# Patient Record
Sex: Male | Born: 1980 | Race: White | Hispanic: No | Marital: Married | Smoking: Former smoker
Health system: Southern US, Community
[De-identification: ages and names within clinical notes are randomized; demographics above are authoritative.]

## PROBLEM LIST (undated history)

## (undated) HISTORY — PX: HERNIA REPAIR: SHX51

---

## 2001-07-17 ENCOUNTER — Emergency Department (HOSPITAL_COMMUNITY): Admission: EM | Admit: 2001-07-17 | Discharge: 2001-07-18 | Payer: Self-pay

## 2001-07-18 ENCOUNTER — Encounter: Payer: Self-pay | Admitting: Emergency Medicine

## 2015-05-02 DIAGNOSIS — R61 Generalized hyperhidrosis: Secondary | ICD-10-CM | POA: Insufficient documentation

## 2016-07-12 ENCOUNTER — Emergency Department (HOSPITAL_COMMUNITY): Payer: BLUE CROSS/BLUE SHIELD | Admitting: Registered Nurse

## 2016-07-12 ENCOUNTER — Encounter (HOSPITAL_BASED_OUTPATIENT_CLINIC_OR_DEPARTMENT_OTHER): Payer: Self-pay | Admitting: *Deleted

## 2016-07-12 ENCOUNTER — Inpatient Hospital Stay (HOSPITAL_BASED_OUTPATIENT_CLINIC_OR_DEPARTMENT_OTHER)
Admission: EM | Admit: 2016-07-12 | Discharge: 2016-07-16 | DRG: 336 | Disposition: A | Discharge status: Home Health | Payer: BLUE CROSS/BLUE SHIELD | Attending: Surgery | Admitting: Surgery

## 2016-07-12 ENCOUNTER — Encounter (HOSPITAL_COMMUNITY): Admission: EM | Disposition: A | Discharge status: Home Health | Payer: Self-pay | Source: Home / Self Care

## 2016-07-12 ENCOUNTER — Emergency Department (HOSPITAL_BASED_OUTPATIENT_CLINIC_OR_DEPARTMENT_OTHER): Payer: BLUE CROSS/BLUE SHIELD

## 2016-07-12 DIAGNOSIS — Z87891 Personal history of nicotine dependence: Secondary | ICD-10-CM

## 2016-07-12 DIAGNOSIS — K560 Paralytic ileus: Secondary | ICD-10-CM | POA: Diagnosis not present

## 2016-07-12 DIAGNOSIS — K567 Ileus, unspecified: Secondary | ICD-10-CM

## 2016-07-12 DIAGNOSIS — R1031 Right lower quadrant pain: Secondary | ICD-10-CM | POA: Diagnosis present

## 2016-07-12 DIAGNOSIS — K352 Acute appendicitis with generalized peritonitis: Secondary | ICD-10-CM | POA: Diagnosis present

## 2016-07-12 DIAGNOSIS — K3532 Acute appendicitis with perforation and localized peritonitis, without abscess: Secondary | ICD-10-CM | POA: Insufficient documentation

## 2016-07-12 DIAGNOSIS — K436 Other and unspecified ventral hernia with obstruction, without gangrene: Secondary | ICD-10-CM

## 2016-07-12 DIAGNOSIS — K353 Acute appendicitis with localized peritonitis, without perforation or gangrene: Secondary | ICD-10-CM

## 2016-07-12 DIAGNOSIS — K420 Umbilical hernia with obstruction, without gangrene: Secondary | ICD-10-CM | POA: Diagnosis present

## 2016-07-12 DIAGNOSIS — K660 Peritoneal adhesions (postprocedural) (postinfection): Secondary | ICD-10-CM | POA: Diagnosis present

## 2016-07-12 DIAGNOSIS — K9189 Other postprocedural complications and disorders of digestive system: Secondary | ICD-10-CM

## 2016-07-12 DIAGNOSIS — K3580 Unspecified acute appendicitis: Secondary | ICD-10-CM

## 2016-07-12 HISTORY — PX: LAPAROSCOPIC APPENDECTOMY: SHX408

## 2016-07-12 LAB — COMPREHENSIVE METABOLIC PANEL
ALT: 18 U/L (ref 17–63)
AST: 16 U/L (ref 15–41)
Albumin: 3.9 g/dL (ref 3.5–5.0)
Alkaline Phosphatase: 73 U/L (ref 38–126)
Anion gap: 10 (ref 5–15)
BUN: 12 mg/dL (ref 6–20)
CO2: 22 mmol/L (ref 22–32)
Calcium: 9.1 mg/dL (ref 8.9–10.3)
Chloride: 105 mmol/L (ref 101–111)
Creatinine, Ser: 0.84 mg/dL (ref 0.61–1.24)
GFR calc Af Amer: >60 mL/min (ref >60)
GFR calc non Af Amer: >60 mL/min (ref >60)
Glucose, Bld: 126 mg/dL — ABNORMAL HIGH (ref 65–99)
Potassium: 3.6 mmol/L (ref 3.5–5.1)
Sodium: 137 mmol/L (ref 135–145)
Total Bilirubin: 2.4 mg/dL — ABNORMAL HIGH (ref 0.3–1.2)
Total Protein: 7.9 g/dL (ref 6.5–8.1)

## 2016-07-12 LAB — CBC WITH DIFFERENTIAL/PLATELET
BASOS PCT: 0 %
Basophils Absolute: 0 10*3/uL (ref 0.0–0.1)
EOS ABS: 0 10*3/uL (ref 0.0–0.7)
EOS PCT: 0 %
HCT: 45.1 % (ref 39.0–52.0)
HEMOGLOBIN: 16.1 g/dL (ref 13.0–17.0)
Lymphocytes Relative: 11 %
Lymphs Abs: 1.5 10*3/uL (ref 0.7–4.0)
MCH: 30.8 pg (ref 26.0–34.0)
MCHC: 35.7 g/dL (ref 30.0–36.0)
MCV: 86.2 fL (ref 78.0–100.0)
Monocytes Absolute: 1.2 10*3/uL — ABNORMAL HIGH (ref 0.1–1.0)
Monocytes Relative: 9 %
NEUTROS PCT: 80 %
Neutro Abs: 11 10*3/uL — ABNORMAL HIGH (ref 1.7–7.7)
PLATELETS: 157 10*3/uL (ref 150–400)
RBC: 5.23 MIL/uL (ref 4.22–5.81)
RDW: 12.6 % (ref 11.5–15.5)
WBC: 13.8 10*3/uL — AB (ref 4.0–10.5)

## 2016-07-12 LAB — URINALYSIS, ROUTINE W REFLEX MICROSCOPIC
Bilirubin Urine: NEGATIVE
Glucose, UA: NEGATIVE mg/dL
Hgb urine dipstick: NEGATIVE
Ketones, ur: 15 mg/dL — AB
Leukocytes, UA: NEGATIVE
Nitrite: NEGATIVE
Protein, ur: NEGATIVE mg/dL
Specific Gravity, Urine: 1.043 — ABNORMAL HIGH (ref 1.005–1.030)
pH: 6 (ref 5.0–8.0)

## 2016-07-12 SURGERY — APPENDECTOMY, LAPAROSCOPIC
Anesthesia: General | Site: Abdomen

## 2016-07-12 MED ORDER — MEPERIDINE HCL 25 MG/ML IJ SOLN
6.2500 mg | INTRAMUSCULAR | Status: DC | PRN
  Filled 2016-07-12: qty 1

## 2016-07-12 MED ORDER — TRAMADOL HCL 50 MG PO TABS: 50.0000 mg | ORAL_TABLET | Freq: Four times a day (QID) | ORAL | 0 refills | Status: DC | PRN

## 2016-07-12 MED ORDER — ACETAMINOPHEN 325 MG PO TABS
650.0000 mg | ORAL_TABLET | Freq: Four times a day (QID) | ORAL | Status: DC | PRN
  Filled 2016-07-12 (×2): qty 2

## 2016-07-12 MED ORDER — ONDANSETRON HCL 4 MG/2ML IJ SOLN: 4.0000 mg | Freq: Once | INTRAMUSCULAR | Status: DC | PRN

## 2016-07-12 MED ORDER — PIPERACILLIN-TAZOBACTAM 3.375 G IVPB
3.3750 g | Freq: Three times a day (TID) | INTRAVENOUS | Status: DC
  Administered 2016-07-12 – 2016-07-16 (×11): 3.375 g via INTRAVENOUS
  Filled 2016-07-12 (×12): qty 50

## 2016-07-12 MED ORDER — GABAPENTIN 300 MG PO CAPS: 300.0000 mg | ORAL_CAPSULE | ORAL | Status: DC

## 2016-07-12 MED ORDER — LIDOCAINE HCL (CARDIAC) 20 MG/ML IV SOLN
INTRAVENOUS | Status: DC | PRN
  Administered 2016-07-12: 75 mg via INTRAVENOUS

## 2016-07-12 MED ORDER — METRONIDAZOLE IN NACL 5-0.79 MG/ML-% IV SOLN: 500.0000 mg | Freq: Once | INTRAVENOUS | Status: DC

## 2016-07-12 MED ORDER — METOPROLOL TARTRATE 5 MG/5ML IV SOLN: 5.0000 mg | Freq: Four times a day (QID) | INTRAVENOUS | Status: DC | PRN

## 2016-07-12 MED ORDER — SUCCINYLCHOLINE CHLORIDE 20 MG/ML IJ SOLN
INTRAMUSCULAR | Status: DC | PRN
  Administered 2016-07-12: 140 mg via INTRAVENOUS

## 2016-07-12 MED ORDER — FENTANYL CITRATE (PF) 100 MCG/2ML IJ SOLN
INTRAMUSCULAR | Status: DC | PRN
  Administered 2016-07-12 (×4): 100 ug via INTRAVENOUS
  Administered 2016-07-12 (×2): 50 ug via INTRAVENOUS

## 2016-07-12 MED ORDER — PHENOL 1.4 % MT LIQD: 2.0000 | OROMUCOSAL | Status: DC | PRN

## 2016-07-12 MED ORDER — CHLORHEXIDINE GLUCONATE CLOTH 2 % EX PADS
6.0000 | MEDICATED_PAD | Freq: Once | CUTANEOUS | Status: AC
  Administered 2016-07-12: 6 via TOPICAL
  Filled 2016-07-12: qty 6

## 2016-07-12 MED ORDER — ONDANSETRON HCL 4 MG/2ML IJ SOLN
4.0000 mg | Freq: Four times a day (QID) | INTRAMUSCULAR | Status: DC | PRN
  Administered 2016-07-13 (×2): 4 mg via INTRAVENOUS
  Filled 2016-07-12 (×3): qty 2

## 2016-07-12 MED ORDER — DIPHENHYDRAMINE HCL 50 MG/ML IJ SOLN: 12.5000 mg | Freq: Four times a day (QID) | INTRAMUSCULAR | Status: DC | PRN

## 2016-07-12 MED ORDER — METRONIDAZOLE IN NACL 5-0.79 MG/ML-% IV SOLN
500.0000 mg | Freq: Three times a day (TID) | INTRAVENOUS | Status: DC
  Administered 2016-07-12 – 2016-07-14 (×6): 500 mg via INTRAVENOUS
  Filled 2016-07-12 (×6): qty 100

## 2016-07-12 MED ORDER — CELECOXIB 400 MG PO CAPS
400.0000 mg | ORAL_CAPSULE | ORAL | Status: DC
  Filled 2016-07-12: qty 1

## 2016-07-12 MED ORDER — SIMETHICONE 80 MG PO CHEW
40.0000 mg | CHEWABLE_TABLET | Freq: Four times a day (QID) | ORAL | Status: DC | PRN
  Administered 2016-07-14 (×2): 40 mg via ORAL
  Filled 2016-07-12 (×2): qty 1

## 2016-07-12 MED ORDER — ROCURONIUM BROMIDE 100 MG/10ML IV SOLN
INTRAVENOUS | Status: DC | PRN
  Administered 2016-07-12 (×2): 10 mg via INTRAVENOUS
  Administered 2016-07-12: 50 mg via INTRAVENOUS

## 2016-07-12 MED ORDER — ALUM & MAG HYDROXIDE-SIMETH 200-200-20 MG/5ML PO SUSP: 30.0000 mL | Freq: Four times a day (QID) | ORAL | Status: DC | PRN

## 2016-07-12 MED ORDER — ACETAMINOPHEN 650 MG RE SUPP
650.0000 mg | Freq: Four times a day (QID) | RECTAL | Status: DC | PRN
Start: 2016-07-12 — End: 2016-07-16

## 2016-07-12 MED ORDER — ONDANSETRON HCL 4 MG/2ML IJ SOLN
4.0000 mg | Freq: Once | INTRAMUSCULAR | Status: AC
  Administered 2016-07-12: 4 mg via INTRAVENOUS
  Filled 2016-07-12: qty 2

## 2016-07-12 MED ORDER — BUPIVACAINE-EPINEPHRINE 0.25% -1:200000 IJ SOLN
INTRAMUSCULAR | Status: DC | PRN
  Administered 2016-07-12: 50 mL

## 2016-07-12 MED ORDER — BUPIVACAINE-EPINEPHRINE 0.25% -1:200000 IJ SOLN
INTRAMUSCULAR | Status: AC
  Filled 2016-07-12: qty 1

## 2016-07-12 MED ORDER — ACETAMINOPHEN 500 MG PO TABS: 1000.0000 mg | ORAL_TABLET | ORAL | Status: DC

## 2016-07-12 MED ORDER — HYDROMORPHONE HCL 1 MG/ML IJ SOLN
INTRAMUSCULAR | Status: AC
  Filled 2016-07-12: qty 1

## 2016-07-12 MED ORDER — SUGAMMADEX SODIUM 200 MG/2ML IV SOLN
INTRAVENOUS | Status: DC | PRN
  Administered 2016-07-12: 200 mg via INTRAVENOUS

## 2016-07-12 MED ORDER — ONDANSETRON 4 MG PO TBDP: 4.0000 mg | ORAL_TABLET | Freq: Four times a day (QID) | ORAL | Status: DC | PRN

## 2016-07-12 MED ORDER — IOPAMIDOL (ISOVUE-300) INJECTION 61%
100.0000 mL | Freq: Once | INTRAVENOUS | Status: AC | PRN
  Administered 2016-07-12: 100 mL via INTRAVENOUS

## 2016-07-12 MED ORDER — LABETALOL HCL 5 MG/ML IV SOLN
INTRAVENOUS | Status: DC | PRN
  Administered 2016-07-12 (×4): 5 mg via INTRAVENOUS

## 2016-07-12 MED ORDER — LIP MEDEX EX OINT
1.0000 "application " | TOPICAL_OINTMENT | Freq: Two times a day (BID) | CUTANEOUS | Status: DC
  Administered 2016-07-12 – 2016-07-15 (×6): 1 via TOPICAL
  Filled 2016-07-12: qty 7

## 2016-07-12 MED ORDER — ENOXAPARIN SODIUM 40 MG/0.4ML ~~LOC~~ SOLN: 40.0000 mg | SUBCUTANEOUS | Status: DC

## 2016-07-12 MED ORDER — KETOROLAC TROMETHAMINE 15 MG/ML IJ SOLN
15.0000 mg | Freq: Four times a day (QID) | INTRAMUSCULAR | Status: DC | PRN
  Administered 2016-07-13: 30 mg via INTRAVENOUS
  Filled 2016-07-12: qty 2

## 2016-07-12 MED ORDER — HYDROMORPHONE HCL 2 MG/ML IJ SOLN
INTRAMUSCULAR | Status: AC
  Filled 2016-07-12: qty 1

## 2016-07-12 MED ORDER — ENOXAPARIN SODIUM 40 MG/0.4ML ~~LOC~~ SOLN
40.0000 mg | SUBCUTANEOUS | Status: DC
  Administered 2016-07-13 – 2016-07-16 (×4): 40 mg via SUBCUTANEOUS
  Filled 2016-07-12 (×4): qty 0.4

## 2016-07-12 MED ORDER — MIDAZOLAM HCL 5 MG/5ML IJ SOLN
INTRAMUSCULAR | Status: DC | PRN
  Administered 2016-07-12: 2 mg via INTRAVENOUS

## 2016-07-12 MED ORDER — METHOCARBAMOL 1000 MG/10ML IJ SOLN: 1000.0000 mg | Freq: Four times a day (QID) | INTRAVENOUS | Status: DC | PRN

## 2016-07-12 MED ORDER — SODIUM CHLORIDE 0.9 % IV BOLUS (SEPSIS)
1000.0000 mL | Freq: Once | INTRAVENOUS | Status: AC
  Administered 2016-07-12: 1000 mL via INTRAVENOUS

## 2016-07-12 MED ORDER — LACTATED RINGERS IV SOLN
INTRAVENOUS | Status: DC | PRN
  Administered 2016-07-12 (×3): via INTRAVENOUS

## 2016-07-12 MED ORDER — BISACODYL 10 MG RE SUPP: 10.0000 mg | Freq: Two times a day (BID) | RECTAL | Status: DC | PRN

## 2016-07-12 MED ORDER — PROPOFOL 10 MG/ML IV BOLUS
INTRAVENOUS | Status: DC | PRN
  Administered 2016-07-12: 30 mg via INTRAVENOUS
  Administered 2016-07-12: 250 mg via INTRAVENOUS
  Administered 2016-07-12: 70 mg via INTRAVENOUS

## 2016-07-12 MED ORDER — HYDROMORPHONE HCL 1 MG/ML IJ SOLN
0.2500 mg | INTRAMUSCULAR | Status: DC | PRN
  Administered 2016-07-12 (×2): 0.5 mg via INTRAVENOUS

## 2016-07-12 MED ORDER — HYDROMORPHONE HCL 1 MG/ML IJ SOLN
0.5000 mg | INTRAMUSCULAR | Status: DC | PRN
  Administered 2016-07-12 – 2016-07-13 (×5): 1 mg via INTRAVENOUS
  Filled 2016-07-12 (×5): qty 1

## 2016-07-12 MED ORDER — MAGIC MOUTHWASH
15.0000 mL | Freq: Four times a day (QID) | ORAL | Status: DC | PRN
Start: 2016-07-12 — End: 2016-07-16
  Filled 2016-07-12: qty 15

## 2016-07-12 MED ORDER — FENTANYL CITRATE (PF) 250 MCG/5ML IJ SOLN
INTRAMUSCULAR | Status: AC
  Filled 2016-07-12: qty 5

## 2016-07-12 MED ORDER — LACTATED RINGERS IV BOLUS (SEPSIS): 1000.0000 mL | Freq: Three times a day (TID) | INTRAVENOUS | Status: AC | PRN

## 2016-07-12 MED ORDER — HYDROMORPHONE HCL 1 MG/ML IJ SOLN
INTRAMUSCULAR | Status: DC | PRN
  Administered 2016-07-12 (×2): 1 mg via INTRAVENOUS

## 2016-07-12 MED ORDER — ONDANSETRON HCL 4 MG/2ML IJ SOLN
INTRAMUSCULAR | Status: DC | PRN
  Administered 2016-07-12: 4 mg via INTRAVENOUS

## 2016-07-12 MED ORDER — MENTHOL 3 MG MT LOZG
1.0000 | LOZENGE | OROMUCOSAL | Status: DC | PRN
  Filled 2016-07-12: qty 9

## 2016-07-12 MED ORDER — SACCHAROMYCES BOULARDII 250 MG PO CAPS
250.0000 mg | ORAL_CAPSULE | Freq: Two times a day (BID) | ORAL | Status: DC
  Administered 2016-07-12 – 2016-07-16 (×8): 250 mg via ORAL
  Filled 2016-07-12 (×8): qty 1

## 2016-07-12 MED ORDER — HYDRALAZINE HCL 20 MG/ML IJ SOLN: 10.0000 mg | INTRAMUSCULAR | Status: DC | PRN

## 2016-07-12 MED ORDER — PROCHLORPERAZINE EDISYLATE 5 MG/ML IJ SOLN: 5.0000 mg | INTRAMUSCULAR | Status: DC | PRN

## 2016-07-12 MED ORDER — ONDANSETRON HCL 4 MG/2ML IJ SOLN
4.0000 mg | Freq: Four times a day (QID) | INTRAMUSCULAR | Status: DC | PRN
  Administered 2016-07-14 – 2016-07-15 (×3): 4 mg via INTRAVENOUS
  Filled 2016-07-12 (×2): qty 2

## 2016-07-12 MED ORDER — LACTATED RINGERS IV SOLN: INTRAVENOUS | Status: DC

## 2016-07-12 MED ORDER — DEXTROSE 5 % IV SOLN
1.0000 g | Freq: Once | INTRAVENOUS | Status: DC
  Filled 2016-07-12: qty 10

## 2016-07-12 MED ORDER — SODIUM CHLORIDE 0.9 % IV SOLN: 8.0000 mg | Freq: Four times a day (QID) | INTRAVENOUS | Status: DC | PRN

## 2016-07-12 MED ORDER — DEXTROSE 5 % IV SOLN
2.0000 g | INTRAVENOUS | Status: AC
  Administered 2016-07-12: 2 g via INTRAVENOUS
  Filled 2016-07-12: qty 2

## 2016-07-12 MED ORDER — SODIUM CHLORIDE 0.9 % IR SOLN
Status: DC | PRN
  Administered 2016-07-12: 1000 mL

## 2016-07-12 MED ORDER — DEXAMETHASONE SODIUM PHOSPHATE 10 MG/ML IJ SOLN
INTRAMUSCULAR | Status: DC | PRN
  Administered 2016-07-12: 10 mg via INTRAVENOUS

## 2016-07-12 MED ORDER — METOCLOPRAMIDE HCL 5 MG/ML IJ SOLN
5.0000 mg | Freq: Four times a day (QID) | INTRAMUSCULAR | Status: DC | PRN
  Administered 2016-07-13 (×3): 10 mg via INTRAVENOUS
  Filled 2016-07-12 (×3): qty 2

## 2016-07-12 MED ORDER — DIPHENHYDRAMINE HCL 12.5 MG/5ML PO ELIX: 12.5000 mg | ORAL_SOLUTION | Freq: Four times a day (QID) | ORAL | Status: DC | PRN

## 2016-07-12 MED ORDER — 0.9 % SODIUM CHLORIDE (POUR BTL) OPTIME
TOPICAL | Status: DC | PRN
  Administered 2016-07-12 (×2): 3000 mL
  Administered 2016-07-12 (×2): 1000 mL

## 2016-07-12 SURGICAL SUPPLY — 43 items
APPLIER CLIP 5 13 M/L LIGAMAX5 (MISCELLANEOUS)
APPLIER CLIP ROT 10 11.4 M/L (STAPLE)
CABLE HIGH FREQUENCY MONO STRZ (ELECTRODE) ×2 IMPLANT
CHLORAPREP W/TINT 26ML (MISCELLANEOUS) ×2 IMPLANT
CLIP APPLIE 5 13 M/L LIGAMAX5 (MISCELLANEOUS) IMPLANT
CLIP APPLIE ROT 10 11.4 M/L (STAPLE) IMPLANT
COVER SURGICAL LIGHT HANDLE (MISCELLANEOUS) ×2 IMPLANT
CUTTER FLEX LINEAR 45M (STAPLE) ×2 IMPLANT
DECANTER SPIKE VIAL GLASS SM (MISCELLANEOUS) ×2 IMPLANT
DEVICE TROCAR PUNCTURE CLOSURE (ENDOMECHANICALS) IMPLANT
DRAPE LAPAROSCOPIC ABDOMINAL (DRAPES) ×2 IMPLANT
DRAPE WARM FLUID 44X44 (DRAPE) ×2 IMPLANT
DRSG TEGADERM 2-3/8X2-3/4 SM (GAUZE/BANDAGES/DRESSINGS) ×2 IMPLANT
DRSG TEGADERM 4X4.75 (GAUZE/BANDAGES/DRESSINGS) ×2 IMPLANT
ELECT REM PT RETURN 9FT ADLT (ELECTROSURGICAL) ×2
ELECTRODE REM PT RTRN 9FT ADLT (ELECTROSURGICAL) ×1 IMPLANT
ENDOLOOP SUT PDS II  0 18 (SUTURE)
ENDOLOOP SUT PDS II 0 18 (SUTURE) IMPLANT
GAUZE SPONGE 2X2 8PLY STRL LF (GAUZE/BANDAGES/DRESSINGS) ×1 IMPLANT
GLOVE ECLIPSE 8.0 STRL XLNG CF (GLOVE) ×2 IMPLANT
GLOVE INDICATOR 8.0 STRL GRN (GLOVE) ×2 IMPLANT
GOWN STRL REUS W/TWL XL LVL3 (GOWN DISPOSABLE) ×4 IMPLANT
IRRIG SUCT STRYKERFLOW 2 WTIP (MISCELLANEOUS) ×2
IRRIGATION SUCT STRKRFLW 2 WTP (MISCELLANEOUS) ×1 IMPLANT
KIT BASIN OR (CUSTOM PROCEDURE TRAY) ×2 IMPLANT
PAD POSITIONING PINK XL (MISCELLANEOUS) ×2 IMPLANT
POUCH SPECIMEN RETRIEVAL 10MM (ENDOMECHANICALS) ×2 IMPLANT
RELOAD 45 VASCULAR/THIN (ENDOMECHANICALS) IMPLANT
RELOAD STAPLE TA45 3.5 REG BLU (ENDOMECHANICALS) ×2 IMPLANT
SCISSORS LAP 5X35 DISP (ENDOMECHANICALS) ×2 IMPLANT
SHEARS HARMONIC ACE PLUS 36CM (ENDOMECHANICALS) IMPLANT
SLEEVE XCEL OPT CAN 5 100 (ENDOMECHANICALS) ×2 IMPLANT
SPONGE GAUZE 2X2 STER 10/PKG (GAUZE/BANDAGES/DRESSINGS) ×1
SUT MNCRL AB 4-0 PS2 18 (SUTURE) ×2 IMPLANT
SUT PDS AB 0 CT1 36 (SUTURE) ×2 IMPLANT
SUT PDS AB 1 CT1 27 (SUTURE) IMPLANT
SUT SILK 2 0 SH (SUTURE) IMPLANT
TOWEL OR 17X26 10 PK STRL BLUE (TOWEL DISPOSABLE) ×2 IMPLANT
TRAY FOLEY W/METER SILVER 16FR (SET/KITS/TRAYS/PACK) IMPLANT
TRAY LAPAROSCOPIC (CUSTOM PROCEDURE TRAY) ×2 IMPLANT
TROCAR BLADELESS OPT 5 100 (ENDOMECHANICALS) ×2 IMPLANT
TROCAR XCEL 12X100 BLDLESS (ENDOMECHANICALS) ×2 IMPLANT
TUBING INSUF HEATED (TUBING) ×2 IMPLANT

## 2016-07-12 NOTE — ED Notes (Signed)
ED Provider at bedside. 

## 2016-07-12 NOTE — H&P (Signed)
[facility]  [address] [place], [place] [address] Phone: [phone] FAX: [phone]     Albert Jones  Apr 26, 1981 [id number]  CARE TEAM:  PCP: Wonda Cerise, MD  Outpatient Care Team: Patient Care Team: Jefm Petty, MD as PCP - General (Family Medicine)  Inpatient Treatment Team: Treatment Team: Attending Provider: [name], MD; Registered Nurse: Elwanda Brooklyn, RN   This patient is a 36 y.o.male who presents today for surgical evaluation at the request of Dr Stark Jock, [facility] ED, [place].   Reason for evaluation: Acute appendicitis  52 year old healthy male began abdominal pain about four days ago.  Wife had abd pain & diarrhea for 24hrs beforehand.  Pt not nearly as sick.  Persistent.  Went to an outside urgent care Center.  Evaluation without any major concerns.  However patient's pain became worse and more intense.  Not had any fevers or chills.  Some nausea.  Decreased appetite and anorexia.  Pain more intense.  Worse with walking and coughing.  Concerned.  Went to [facility].  Evaluation concerning.  CT scan highly suspicious of appendicitis.  No obvious free air or abscess noted.  Surgical consultation requested.  Recommendation made for transfer to was in the hospital or surgical service is available.  No personal nor family history of GI/colon cancer, inflammatory bowel disease, irritable bowel syndrome, allergy such as Celiac Sprue, dietary/dairy problems, colitis, ulcers nor gastritis.  No travel outside the country.  No changes in diet.  No dysphagia to solids or liquids.  No significant heartburn or reflux.  No hematochezia, hematemesis, coffee ground emesis.  No evidence of prior gastric/peptic ulceration.    Assessment  Albert Jones  36 y.o. male       Problem List:  Principal Problem:   Acute appendicitis Active Problems:   Incarcerated epigastric hernia   History and  physical and CT scan very suspicious for acute appendicitis  Plan:  Diagnostic laparoscopy with appendectomy:  The anatomy & physiology of the digestive tract was discussed.  The pathophysiology of appendicitis and other appendiceal disorders were discussed.  Natural history risks without surgery was discussed.   I feel the risks of no intervention will lead to serious problems that outweigh the operative risks; therefore, I recommended diagnostic laparoscopy with removal of appendix to remove the pathology.  Laparoscopic & open techniques were discussed.   I noted a good likelihood this will help address the problem.   Risks such as bleeding, infection, abscess, leak, reoperation, injury to other organs, need for repair of tissues / organs, possible ostomy, hernia, heart attack, stroke, death, and other risks were discussed.  Goals of post-operative recovery were discussed as well.  We will work to minimize complications.  Questions were answered.  The patient expresses understanding & wishes to proceed with surgery.  -Will plan to reduce & place port at supraumb hernia.  Just primary repair, no mesh in this situation:  The anatomy & physiology of the abdominal wall was discussed.  The pathophysiology of hernias was discussed.  Natural history risks without surgery including progeressive enlargement, pain, incarceration, & strangulation was discussed.   Contributors to complications such as smoking, obesity, diabetes, prior surgery, etc were discussed.   I feel the risks of no intervention will lead to serious problems that outweigh the operative risks; therefore, I recommended surgery to reduce and repair the hernia.  I explained laparoscopic techniques with possible need for an open  approach.  I noted the probable use of mesh to patch and/or buttress the hernia repair  Risks such as bleeding, infection, abscess, need for further treatment, injury to other organs, need for repair of tissues /  organs, stroke, heart attack, death, and other risks were discussed.  I noted a good likelihood this will help address the problem.   Goals of post-operative recovery were discussed as well.  Possibility that this will not correct all symptoms was explained.  I stressed the importance of low-impact activity, aggressive pain control, avoiding constipation, & not pushing through pain to minimize risk of post-operative chronic pain or injury. Possibility of reherniation especially with smoking, obesity, diabetes, immunosuppression, and other health conditions was discussed.  We will work to minimize complications.     An educational handout further explaining the pathology & treatment options was given as well.  Questions were answered.  The patient expresses understanding & wishes to proceed with surgery.      -VTE prophylaxis- SCDs, etc -mobilize as tolerated to help recovery    Adin Hector, M.D., F.A.C.S. Gastrointestinal and Minimally Invasive Surgery [facility] [address], [place], [place] [address] [phone] Main / Paging   07/12/2016      History reviewed. No pertinent past medical history.  History reviewed. No pertinent surgical history.  Social History   Social History  . Marital status: Married    Spouse name: N/A  . Number of children: N/A  . Years of education: N/A   Occupational History  . Not on file.   Social History Main Topics  . Smoking status: Former Research scientist (life sciences)  . Smokeless tobacco: Former Systems developer  . Alcohol use Yes     Comment: occassionally  . Drug use: Yes    Frequency: 3.0 times per week    Types: Marijuana  . Sexual activity: Not on file   Other Topics Concern  . Not on file   Social History Narrative  . No narrative on file    No family history on file.  Current Facility-Administered Medications  Medication Dose Route Frequency Provider Last Rate Last Dose  . cefTRIAXone (ROCEPHIN) 1 g in  dextrose 5 % 50 mL IVPB  1 g Intravenous Once [name], MD      . metroNIDAZOLE (FLAGYL) IVPB 500 mg  500 mg Intravenous Once [name], MD       No current outpatient prescriptions on file.     No Known Allergies  ROS: Constitutional:  No fevers, chills, sweats.  Weight stable Eyes:  No vision changes, No discharge HENT:  No sore throats, nasal drainage Lymph: No neck swelling, No bruising easily Pulmonary:  No cough, productive sputum CV: No orthopnea, PND  Patient walks 30 minutes for about 1 miles without difficulty.  No exertional chest/neck/shoulder/arm pain. GI: No personal nor family history of GI/colon cancer, inflammatory bowel disease, irritable bowel syndrome, allergy such as Celiac Sprue, dietary/dairy problems, colitis, ulcers nor gastritis.  No recent sick contacts/gastroenteritis.  No travel outside the country.  No changes in diet. Renal: No UTIs, No hematuria Genital:  No drainage, bleeding, masses Musculoskeletal: No severe joint pain.  Good ROM major joints Skin:  No sores or lesions.  No rashes Heme/Lymph:  No easy bleeding.  No swollen lymph nodes Neuro: No focal weakness/numbness.  No seizures Psych: No suicidal ideation.  No hallucinations  BP 130/80 (BP Location: Right Arm)   Pulse 86   Temp 98.6 F (37 C) (Oral)  Resp 14   Ht 6' (1.829 m)   Wt 95.3 kg (210 lb)   SpO2 98%   BMI 28.48 kg/m   Physical Exam: General: Pt awake/alert/oriented x4 in no major acute distress Eyes: PERRL, normal EOM. Sclera nonicteric Neuro: CN II-XII intact w/o focal sensory/motor deficits. Lymph: No head/neck/groin lymphadenopathy Psych:  No delerium/psychosis/paranoia HENT: Normocephalic, Mucus membranes moist.  No thrush Neck: Supple, No tracheal deviation Chest: No pain.  Good respiratory excursion. CV:  Pulses intact.  Regular rhythm Abdomen: Soft, TTP RLQ with guarding & percussive TTP.  Supraumb SQ mass c/w incarc hernia.  Nondistended.  Nontender.  No  incarcerated hernias. Gen:  No inguinal hernias.  No inguinal lymphadenopathy.   Ext:  SCDs BLE.  No significant edema.  No cyanosis Skin: No petechiae / purpurea.  No major sores Musculoskeletal: No severe joint pain.  Good ROM major joints   Results:   Labs: Results for orders placed or performed during the hospital encounter of 07/12/16 (from the past 48 hour(s))  Comprehensive metabolic panel     Status: Abnormal   Collection Time: 07/12/16 11:00 AM  Result Value Ref Range   Sodium 137 135 - 145 mmol/L   Potassium 3.6 3.5 - 5.1 mmol/L   Chloride 105 101 - 111 mmol/L   CO2 22 22 - 32 mmol/L   Glucose, Bld 126 (H) 65 - 99 mg/dL   BUN 12 6 - 20 mg/dL   Creatinine, Ser 0.84 0.61 - 1.24 mg/dL   Calcium 9.1 8.9 - 10.3 mg/dL   Total Protein 7.9 6.5 - 8.1 g/dL   Albumin 3.9 3.5 - 5.0 g/dL   AST 16 15 - 41 U/L   ALT 18 17 - 63 U/L   Alkaline Phosphatase 73 38 - 126 U/L   Total Bilirubin 2.4 (H) 0.3 - 1.2 mg/dL   GFR calc non Af Amer >60 >60 mL/min   GFR calc Af Amer >60 >60 mL/min    Comment: (NOTE) The eGFR has been calculated using the CKD EPI equation. This calculation has not been validated in all clinical situations. eGFR's persistently <60 mL/min signify possible Chronic Kidney Disease.    Anion gap 10 5 - 15  CBC with Differential     Status: Abnormal   Collection Time: 07/12/16 11:00 AM  Result Value Ref Range   WBC 13.8 (H) 4.0 - 10.5 K/uL   RBC 5.23 4.22 - 5.81 MIL/uL   Hemoglobin 16.1 13.0 - 17.0 g/dL   HCT 45.1 39.0 - 52.0 %   MCV 86.2 78.0 - 100.0 fL   MCH 30.8 26.0 - 34.0 pg   MCHC 35.7 30.0 - 36.0 g/dL   RDW 12.6 11.5 - 15.5 %   Platelets 157 150 - 400 K/uL   Neutrophils Relative % 80 %   Neutro Abs 11.0 (H) 1.7 - 7.7 K/uL   Lymphocytes Relative 11 %   Lymphs Abs 1.5 0.7 - 4.0 K/uL   Monocytes Relative 9 %   Monocytes Absolute 1.2 (H) 0.1 - 1.0 K/uL   Eosinophils Relative 0 %   Eosinophils Absolute 0.0 0.0 - 0.7 K/uL   Basophils Relative 0 %    Basophils Absolute 0.0 0.0 - 0.1 K/uL  Urinalysis, Routine w reflex microscopic     Status: Abnormal   Collection Time: 07/12/16 12:10 PM  Result Value Ref Range   Color, Urine AMBER (A) YELLOW    Comment: BIOCHEMICALS MAY BE AFFECTED BY COLOR   APPearance CLEAR CLEAR  Specific Gravity, Urine 1.043 (H) 1.005 - 1.030   pH 6.0 5.0 - 8.0   Glucose, UA NEGATIVE NEGATIVE mg/dL   Hgb urine dipstick NEGATIVE NEGATIVE   Bilirubin Urine NEGATIVE NEGATIVE   Ketones, ur 15 (A) NEGATIVE mg/dL   Protein, ur NEGATIVE NEGATIVE mg/dL   Nitrite NEGATIVE NEGATIVE   Leukocytes, UA NEGATIVE NEGATIVE    Comment: Microscopic not done on urines with negative protein, blood, leukocytes, nitrite, or glucose < 500 mg/dL.    Imaging / Studies: Ct Abdomen Pelvis W Contrast  Result Date: 07/12/2016 CLINICAL DATA:  Right-sided abdominal pain 5 days with nausea and some vomiting. Diaphoresis today. Elevated white blood cell count. EXAM: CT ABDOMEN AND PELVIS WITH CONTRAST TECHNIQUE: Multidetector CT imaging of the abdomen and pelvis was performed using the standard protocol following bolus administration of intravenous contrast. CONTRAST:  144m ISOVUE-300 IOPAMIDOL (ISOVUE-300) INJECTION 61% COMPARISON:  08/03/2011 FINDINGS: Lower chest: Bibasilar linear atelectasis. Calcified granuloma over the right lower lobe. Hepatobiliary: Within normal. Pancreas: Within normal. Spleen: Within normal. Adrenals/Urinary Tract: Adrenal glands are normal. Kidneys are normal in size without hydronephrosis or nephrolithiasis. Ureters and bladder are normal. Stomach/Bowel: Stomach is normal. Small bowel is unremarkable. Colon is within normal. There is a moderately inflamed appendix in the right lower quadrant in the retrocecal location with appendicolith at the tip. There is adjacent inflammatory change and small amount of adjacent free fluid. No evidence of perforation or abscess. Few small adjacent mesenteric lymph nodes.  Vascular/Lymphatic: Within normal. Reproductive: Within normal. Other: Suggestion of a tiny and ventral hernia over the midline upper abdomen containing only cement are mesocolon mesenteric fat. Musculoskeletal: Within normal. IMPRESSION: Evidence of acute appendicitis. No perforation or abscess. Appendix is retrocecal in location. Bibasilar linear atelectasis. Tiny midline ventral hernia over the upper abdomen containing only mesenteric fat. Critical Value/emergent results were called by telephone at the time of interpretation on 07/12/2016 at 12:34 pm to Dr. DVeryl Speak, who verbally acknowledged these results. Electronically Signed   By: DMarin OlpM.D.   On: 07/12/2016 12:34    Medications / Allergies: per chart  Antibiotics: Anti-infectives    Start     Dose/Rate Route Frequency Ordered Stop   07/12/16 1315  cefTRIAXone (ROCEPHIN) 1 g in dextrose 5 % 50 mL IVPB     1 g 100 mL/hr over 30 Minutes Intravenous  Once 07/12/16 1303     07/12/16 1315  metroNIDAZOLE (FLAGYL) IVPB 500 mg     500 mg 100 mL/hr over 60 Minutes Intravenous  Once 07/12/16 1303          Note: Portions of this report may have been transcribed using voice recognition software. Every effort was made to ensure accuracy; however, inadvertent computerized transcription errors may be present.   Any transcriptional errors that result from this process are unintentional.    SAdin Hector M.D., F.A.C.S. Gastrointestinal and Minimally Invasive Surgery [facility] [address] [place] [phone] / Paging   07/12/2016

## 2016-07-12 NOTE — ED Provider Notes (Signed)
[facility] Provider Note   CSN: [id number] Arrival date & time: 07/12/16  0940     History   Chief Complaint Chief Complaint  Patient presents with  . Abdominal Pain    right     HPI Albert Jones is a 36 y.o. male.  Patient is a 36 year old male with no significant past medical history. He presents for evaluation of right-sided abdominal pain. This began 4 days ago and is worsening. He was seen 2 days ago in urgent care, however no cause was found. His pain is worsening. He denies any fevers or chills. He reports decreased appetite and decreased by mouth intake.   The history is provided by the patient.  Abdominal Pain   This is a new problem. Episode onset: 4 days ago. The problem occurs constantly. The problem has been gradually worsening. The pain is located in the RLQ. The pain is moderate. Associated symptoms include nausea. Pertinent negatives include fever, flatus and vomiting. Exacerbated by: Walking and palpation. Nothing relieves the symptoms.    History reviewed. No pertinent past medical history.  There are no active problems to display for this patient.   History reviewed. No pertinent surgical history.     Home Medications    Prior to Admission medications   Not on File    Family History No family history on file.  Social History Social History  Substance Use Topics  . Smoking status: Former Games developermoker  . Smokeless tobacco: Former NeurosurgeonUser  . Alcohol use Yes     Comment: occassionally     Allergies   Patient has no known allergies.   Review of Systems Review of Systems  Constitutional: Negative for fever.  Gastrointestinal: Positive for abdominal pain and nausea. Negative for flatus and vomiting.  All other systems reviewed and are negative.    Physical Exam Updated Vital Signs BP (!) 128/91 (BP Location: Left Arm)   Pulse (!) 102   Temp 98.6 F (37 C) (Oral)   Resp (!) 22   Ht 6' (1.829 m)   Wt 210 lb (95.3 kg)   SpO2  98%   BMI 28.48 kg/m   Physical Exam  Constitutional: He is oriented to person, place, and time. He appears well-developed and well-nourished. No distress.  HENT:  Head: Normocephalic and atraumatic.  Mouth/Throat: Oropharynx is clear and moist.  Neck: Normal range of motion. Neck supple.  Cardiovascular: Normal rate and regular rhythm.  Exam reveals no friction rub.   No murmur heard. Pulmonary/Chest: Effort normal and breath sounds normal. No respiratory distress. He has no wheezes. He has no rales.  Abdominal: Soft. Bowel sounds are normal. He exhibits no distension. There is tenderness.  There is tenderness in the right lower quadrant and right mid abdomen. There is no rebound or guarding.  Musculoskeletal: Normal range of motion. He exhibits no edema.  Neurological: He is alert and oriented to person, place, and time. Coordination normal.  Skin: Skin is warm and dry. He is not diaphoretic.  Nursing note and vitals reviewed.    ED Treatments / Results  Labs (all labs ordered are listed, but only abnormal results are displayed) Labs Reviewed  URINALYSIS, ROUTINE W REFLEX MICROSCOPIC  COMPREHENSIVE METABOLIC PANEL  CBC WITH DIFFERENTIAL/PLATELET    EKG  EKG Interpretation None       Radiology No results found.  Procedures Procedures (including critical care time)  Medications Ordered in ED Medications  sodium chloride 0.9 % bolus 1,000 mL (not administered)  Initial Impression / Assessment and Plan / ED Course  I have reviewed the triage vital signs and the nursing notes.  Pertinent labs & imaging results that were available during my care of the patient were reviewed by me and considered in my medical decision making (see chart for details).  CT scan reveals appendicitis.  I have discussed this finding with Dr. Michaell Cowing from general surgery. He is recommending an ER to ER transfer to [place] [facility]. I spoke with Dr. Adriana Simas who agrees to accept the patient in  transfer. Patient will go by private auto. His antibiotics will be deferred until he reaches [facility] ER so as to not delay transfer.  Final Clinical Impressions(s) / ED Diagnoses   Final diagnoses:  None    New Prescriptions New Prescriptions   No medications on file     Geoffery Lyons, MD 07/12/16 [phone]

## 2016-07-12 NOTE — ED Triage Notes (Addendum)
Patient states he developed right sided abdominal pain four days ago.  States he was seen at an Urgent Care two days ago and had xray which was negative for stool and stones.  States the pain has progressively gotten worse and is associated with nausea and small amounts vomiting and diarrhea.  Firm area noted on the mid right abdomen.  Lifted heavy objects the day before pain started.

## 2016-07-12 NOTE — Discharge Instructions (Signed)
SURGERY: POST OP INSTRUCTIONS °(Surgery for small bowel obstruction, colon resection, etc) ° ° °###################################################################### ° °EAT °Gradually transition to a high fiber diet with a fiber supplement over the next few days after discharge ° °WALK °Walk an hour a day.  Control your pain to do that.   ° °CONTROL PAIN °Control pain so that you can walk, sleep, tolerate sneezing/coughing, go up/down stairs. ° °HAVE A BOWEL MOVEMENT DAILY °Keep your bowels regular to avoid problems.  OK to try a laxative to override constipation.  OK to use an antidairrheal to slow down diarrhea.  Call if not better after 2 tries ° °CALL IF YOU HAVE PROBLEMS/CONCERNS °Call if you are still struggling despite following these instructions. °Call if you have concerns not answered by these instructions ° °###################################################################### ° ° °DIET °Follow a light diet the first few days at home.  Start with a bland diet such as soups, liquids, starchy foods, low fat foods, etc.  If you feel full, bloated, or constipated, stay on a ful liquid or pureed/blenderized diet for a few days until you feel better and no longer constipated. °Be sure to drink plenty of fluids every day to avoid getting dehydrated (feeling dizzy, not urinating, etc.). °Gradually add a fiber supplement to your diet over the next week.  Gradually get back to a regular solid diet.  Avoid fast food or heavy meals the first week as you are more likely to get nauseated. °It is expected for your digestive tract to need a few months to get back to normal.  It is common for your bowel movements and stools to be irregular.  You will have occasional bloating and cramping that should eventually fade away.  Until you are eating solid food normally, off all pain medications, and back to regular activities; your bowels will not be normal. °Focus on eating a low-fat, high fiber diet the rest of your life  (See Getting to Good Bowel Health, below). ° °CARE of your INCISION or WOUND °It is good for closed incision and even open wounds to be washed every day.  Shower every day.  Short baths are fine.  Wash the incisions and wounds clean with soap & water.    °If you have a closed incision(s), wash the incision with soap & water every day.  You may leave closed incisions open to air if it is dry.   You may cover the incision with clean gauze & replace it after your daily shower for comfort. °If you have skin tapes (Steristrips) or skin glue (Dermabond) on your incision, leave them in place.  They will fall off on their own like a scab.  You may trim any edges that curl up with clean scissors.  If you have staples, set up an appointment for them to be removed in the office in 10 days after surgery.  °If you have a drain, wash around the skin exit site with soap & water and place a new dressing of gauze or band aid around the skin every day.  Keep the drain site clean & dry.    °If you have an open wound with packing, see wound care instructions.  In general, it is encouraged that you remove your dressing and packing, shower with soap & water, and replace your dressing once a day.  Pack the wound with clean gauze moistened with normal (0.9%) saline to keep the wound moist & uninfected.  Pressure on the dressing for 30 minutes will stop most wound   bleeding.  Eventually your body will heal & pull the open wound closed over the next few months.  °Raw open wounds will occasionally bleed or secrete yellow drainage until it heals closed.  Drain sites will drain a little until the drain is removed.  Even closed incisions can have mild bleeding or drainage the first few days until the skin edges scab over & seal.   °If you have an open wound with a wound vac, see wound vac care instructions. ° ° ° ° °ACTIVITIES as tolerated °Start light daily activities --- self-care, walking, climbing stairs-- beginning the day after surgery.   Gradually increase activities as tolerated.  Control your pain to be active.  Stop when you are tired.  Ideally, walk several times a day, eventually an hour a day.   °Most people are back to most day-to-day activities in a few weeks.  It takes 4-8 weeks to get back to unrestricted, intense activity. °If you can walk 30 minutes without difficulty, it is safe to try more intense activity such as jogging, treadmill, bicycling, low-impact aerobics, swimming, etc. °Save the most intensive and strenuous activity for last (Usually 4-8 weeks after surgery) such as sit-ups, heavy lifting, contact sports, etc.  Refrain from any intense heavy lifting or straining until you are off narcotics for pain control.  You will have off days, but things should improve week-by-week. °DO NOT PUSH THROUGH PAIN.  Let pain be your guide: If it hurts to do something, don't do it.  Pain is your body warning you to avoid that activity for another week until the pain goes down. °You may drive when you are no longer taking narcotic prescription pain medication, you can comfortably wear a seatbelt, and you can safely make sudden turns/stops to protect yourself without hesitating due to pain. °You may have sexual intercourse when it is comfortable. If it hurts to do something, stop. ° °MEDICATIONS °Take your usually prescribed home medications unless otherwise directed.   °Blood thinners:  °Usually you can restart any strong blood thinners after the second postoperative day.  It is OK to take aspirin right away.    ° If you are on strong blood thinners (warfarin/Coumadin, Plavix, Xerelto, Eliquis, Pradaxa, etc), discuss with your surgeon, medicine PCP, and/or cardiologist for instructions on when to restart the blood thinner & if blood monitoring is needed (PT/INR blood check, etc).   ° ° °PAIN CONTROL °Pain after surgery or related to activity is often due to strain/injury to muscle, tendon, nerves and/or incisions.  This pain is usually  short-term and will improve in a few months.  °To help speed the process of healing and to get back to regular activity more quickly, DO THE FOLLOWING THINGS TOGETHER: °1. Increase activity gradually.  DO NOT PUSH THROUGH PAIN °2. Use Ice and/or Heat °3. Try Gentle Massage and/or Stretching °4. Take over the counter pain medication °5. Take Narcotic prescription pain medication for more severe pain ° °Good pain control = faster recovery.  It is better to take more medicine to be more active than to stay in bed all day to avoid medications. °1.  Increase activity gradually °Avoid heavy lifting at first, then increase to lifting as tolerated over the next 6 weeks. °Do not “push through” the pain.  Listen to your body and avoid positions and maneuvers than reproduce the pain.  Wait a few days before trying something more intense °Walking an hour a day is encouraged to help your body recover faster   and more safely.  Start slowly and stop when getting sore.  If you can walk 30 minutes without stopping or pain, you can try more intense activity (running, jogging, aerobics, cycling, swimming, treadmill, sex, sports, weightlifting, etc.) °Remember: If it hurts to do it, then don’t do it! °2. Use Ice and/or Heat °You will have swelling and bruising around the incisions.  This will take several weeks to resolve. °Ice packs or heating pads (6-8 times a day, 30-60 minutes at a time) will help sooth soreness & bruising. °Some people prefer to use ice alone, heat alone, or alternate between ice & heat.  Experiment and see what works best for you.  Consider trying ice for the first few days to help decrease swelling and bruising; then, switch to heat to help relax sore spots and speed recovery. °Shower every day.  Short baths are fine.  It feels good!  Keep the incisions and wounds clean with soap & water.   °3. Try Gentle Massage and/or Stretching °Massage at the area of pain many times a day °Stop if you feel pain - do not  overdo it °4. Take over the counter pain medication °This helps the muscle and nerve tissues become less irritable and calm down faster °Choose ONE of the following over-the-counter anti-inflammatory medications: °Acetaminophen 500mg tabs (Tylenol) 1-2 pills with every meal and just before bedtime (avoid if you have liver problems or if you have acetaminophen in you narcotic prescription) °Naproxen 220mg tabs (ex. Aleve, Naprosyn) 1-2 pills twice a day (avoid if you have kidney, stomach, IBD, or bleeding problems) °Ibuprofen 200mg tabs (ex. Advil, Motrin) 3-4 pills with every meal and just before bedtime (avoid if you have kidney, stomach, IBD, or bleeding problems) °Take with food/snack several times a day as directed for at least 2 weeks to help keep pain / soreness down & more manageable. °5. Take Narcotic prescription pain medication for more severe pain °A prescription for strong pain control is often given to you upon discharge (for example: oxycodone/Percocet, hydrocodone/Norco/Vicodin, or tramadol/Ultram) °Take your pain medication as prescribed. °Be mindful that most narcotic prescriptions contain Tylenol (acetaminophen) as well - avoid taking too much Tylenol. °If you are having problems/concerns with the prescription medicine (does not control pain, nausea, vomiting, rash, itching, etc.), please call us [phone] to see if we need to switch you to a different pain medicine that will work better for you and/or control your side effects better. °If you need a refill on your pain medication, you must call the office before 4 pm and on weekdays only.  By federal law, prescriptions for narcotics cannot be called into a pharmacy.  They must be filled out on paper & picked up from our office by the patient or authorized caretaker.  Prescriptions cannot be filled after 4 pm nor on weekends.   ° °WHEN TO CALL US [phone] °Severe uncontrolled or worsening pain  °Fever over 101 F (38.5 C) °Concerns with  the incision: Worsening pain, redness, rash/hives, swelling, bleeding, or drainage °Reactions / problems with new medications (itching, rash, hives, nausea, etc.) °Nausea and/or vomiting °Difficulty urinating °Difficulty breathing °Worsening fatigue, dizziness, lightheadedness, blurred vision °Other concerns °If you are not getting better after two weeks or are noticing you are getting worse, contact our office [phone] for further advice.  We may need to adjust your medications, re-evaluate you in the office, send you to the emergency room, or see what other things we can do to help. °The   clinic staff is available to answer your questions during regular business hours (8:30am-5pm).  Please don’t hesitate to call and ask to speak to one of our nurses for clinical concerns.    °A surgeon from [facility] is always on call at the hospitals 24 hours/day °If you have a medical emergency, go to the nearest emergency room or call 911. ° °FOLLOW UP in our office °One the day of your discharge from the hospital (or the next business weekday), please call [facility] to set up or confirm an appointment to see your surgeon in the office for a follow-up appointment.  Usually it is 2-3 weeks after your surgery.   °If you have skin staples at your incision(s), let the office know so we can set up a time in the office for the nurse to remove them (usually around 10 days after surgery). °Make sure that you call for appointments the day of discharge (or the next business weekday) from the hospital to ensure a convenient appointment time. °IF YOU HAVE DISABILITY OR FAMILY LEAVE FORMS, BRING THEM TO THE OFFICE FOR PROCESSING.  DO NOT GIVE THEM TO YOUR DOCTOR. ° °[facility] °[address], [address], [place], [place]  [address] ? °[phone] - Main °[phone] - Toll Free,  [phone] - Fax °[email or web address] ° °GETTING TO GOOD BOWEL HEALTH. °It is  expected for your digestive tract to need a few months to get back to normal.  It is common for your bowel movements and stools to be irregular.  You will have occasional bloating and cramping that should eventually fade away.  Until you are eating solid food normally, off all pain medications, and back to regular activities; your bowels will not be normal.   °Avoiding constipation °The goal: ONE SOFT BOWEL MOVEMENT A DAY!    °Drink plenty of fluids.  Choose water first. °TAKE A FIBER SUPPLEMENT EVERY DAY THE REST OF YOUR LIFE °During your first week back home, gradually add back a fiber supplement every day °Experiment which form you can tolerate.   There are many forms such as powders, tablets, wafers, gummies, etc °Psyllium bran (Metamucil), methylcellulose (Citrucel), Miralax or Glycolax, Benefiber, Flax Seed.  °Adjust the dose week-by-week (1/2 dose/day to 6 doses a day) until you are moving your bowels 1-2 times a day.  Cut back the dose or try a different fiber product if it is giving you problems such as diarrhea or bloating. °Sometimes a laxative is needed to help jump-start bowels if constipated until the fiber supplement can help regulate your bowels.  If you are tolerating eating & you are farting, it is okay to try a gentle laxative such as double dose MiraLax, prune juice, or Milk of Magnesia.  Avoid using laxatives too often. °Stool softeners can sometimes help counteract the constipating effects of narcotic pain medicines.  It can also cause diarrhea, so avoid using for too long. °If you are still constipated despite taking fiber daily, eating solids, and a few doses of laxatives, call our office. °Controlling diarrhea °Try drinking liquids and eating bland foods for a few days to avoid stressing your intestines further. °Avoid dairy products (especially milk & ice cream) for a short time.  The intestines often can lose the ability to digest lactose when stressed. °Avoid foods that cause gassiness or  bloating.  Typical foods include beans and other legumes, cabbage, broccoli, and dairy foods.  Avoid greasy, spicy, fast foods.  Every person has   some sensitivity to other foods, so listen to your body and avoid those foods that trigger problems for you. °Probiotics (such as active yogurt, Align, etc) may help repopulate the intestines and colon with normal bacteria and calm down a sensitive digestive tract °Adding a fiber supplement gradually can help thicken stools by absorbing excess fluid and retrain the intestines to act more normally.  Slowly increase the dose over a few weeks.  Too much fiber too soon can backfire and cause cramping & bloating. °It is okay to try and slow down diarrhea with a few doses of antidiarrheal medicines.   °Bismuth subsalicylate (ex. Kayopectate, Pepto Bismol) for a few doses can help control diarrhea.  Avoid if pregnant.   °Loperamide (Imodium) can slow down diarrhea.  Start with one tablet (2mg) first.  Avoid if you are having fevers or severe pain.  °ILEOSTOMY PATIENTS WILL HAVE CHRONIC DIARRHEA since their colon is not in use.    °Drink plenty of liquids.  You will need to drink even more glasses of water/liquid a day to avoid getting dehydrated. °Record output from your ileostomy.  Expect to empty the bag every 3-4 hours at first.  Most people with a permanent ileostomy empty their bag 4-6 times at the least.   °Use antidiarrheal medicine (especially Imodium) several times a day to avoid getting dehydrated.  Start with a dose at bedtime & breakfast.  Adjust up or down as needed.  Increase antidiarrheal medications as directed to avoid emptying the bag more than 8 times a day (every 3 hours). °Work with your wound ostomy nurse to learn care for your ostomy.  See ostomy care instructions. °TROUBLESHOOTING IRREGULAR BOWELS °1) Start with a soft & bland diet. No spicy, greasy, or fried foods.  °2) Avoid gluten/wheat or dairy products from diet to see if symptoms improve. °3) Miralax  17gm or flax seed mixed in 8oz. water or juice-daily. May use 2-4 times a day as needed. °4) Gas-X, Phazyme, etc. as needed for gas & bloating.  °5) Prilosec (omeprazole) over-the-counter as needed °6)  Consider probiotics (Align, Activa, etc) to help calm the bowels down ° °Call your doctor if you are getting worse or not getting better.  Sometimes further testing (cultures, endoscopy, X-ray studies, CT scans, bloodwork, etc.) may be needed to help diagnose and treat the cause of the diarrhea. °[facility] °[address], [address], [place], [place]  [address] °[phone] - Main.    °[phone]  - Toll Free.   [phone] - Fax °[email or web address] ° ° ° °Appendicitis °The appendix is a finger-shaped tube that is attached to the large intestine. Appendicitis is inflammation of the appendix. Without treatment, appendicitis can cause the appendix to tear (rupture). A ruptured appendix can lead to a life-threatening infection. It can also lead to the formation of a painful collection of pus (abscess) in the appendix. °What are the causes? °This condition may be caused by a blockage in the appendix that leads to infection. The blockage can be due to: °· A ball of stool. °· Enlarged lymph glands. °In some cases, the cause may not be known. °What increases the risk? °This condition is more likely to develop in people who are 10-30 years of age. °What are the signs or symptoms? °Symptoms of this condition include: °· Pain around the belly button that moves toward the lower right abdomen. The pain can become more severe as time passes. It gets worse with coughing or sudden movements. °·   Tenderness in the lower right abdomen. °· Nausea. °· Vomiting. °· Loss of appetite. °· Fever. °· Constipation. °· Diarrhea. °· Generally not feeling well. °How is this diagnosed? °This condition may be diagnosed with: °· A physical exam. °· Blood tests. °· Urine test. °To confirm the diagnosis, an  ultrasound, MRI, or CT scan may be done. °How is this treated? °This condition is usually treated by taking out the appendix (appendectomy). There are two methods for doing an appendectomy: °· Open appendectomy. In this surgery, the appendix is removed through a large cut (incision) that is made in the lower right abdomen. This procedure may be recommended if: °¨ You have major scarring from a previous surgery. °¨ You have a bleeding disorder. °¨ You are pregnant and are near term. °¨ You have a condition that makes the laparoscopic procedure impossible, such as an advanced infection or a ruptured appendix. °· Laparoscopic appendectomy. In this surgery, the appendix is removed through small incisions. This procedure usually causes less pain and fewer problems than an open appendectomy. It also has a shorter recovery time. °If the appendix has ruptured and an abscess has formed, a drain may be placed into the abscess to remove fluid and antibiotic medicines may be given through an IV tube. The appendix may or may not need to be removed. °This information is not intended to replace advice given to you by your health care provider. Make sure you discuss any questions you have with your health care provider. °Document Released: 04/13/2005 Document Revised: 08/21/2015 Document Reviewed: 08/29/2014 °Elsevier Interactive Patient Education © 2017 [employer] ° °

## 2016-07-12 NOTE — Anesthesia Preprocedure Evaluation (Signed)
Anesthesia Evaluation  Patient identified by MRN, date of birth, ID band Patient awake    Reviewed: Allergy & Precautions, NPO status , Patient's Chart, lab work & pertinent test results  Airway Mallampati: I  TM Distance: >3 FB Neck ROM: Full    Dental   Pulmonary former smoker,    Pulmonary exam normal        Cardiovascular Normal cardiovascular exam     Neuro/Psych    GI/Hepatic   Endo/Other    Renal/GU      Musculoskeletal   Abdominal   Peds  Hematology   Anesthesia Other Findings   Reproductive/Obstetrics                             Anesthesia Physical Anesthesia Plan  ASA: II and emergent  Anesthesia Plan: General   Post-op Pain Management:    Induction: Intravenous, Rapid sequence and Cricoid pressure planned  Airway Management Planned: Oral ETT  Additional Equipment:   Intra-op Plan:   Post-operative Plan: Extubation in OR  Informed Consent: I have reviewed the patients History and Physical, chart, labs and discussed the procedure including the risks, benefits and alternatives for the proposed anesthesia with the patient or authorized representative who has indicated his/her understanding and acceptance.     Plan Discussed with: CRNA and Surgeon  Anesthesia Plan Comments:         Anesthesia Quick Evaluation  

## 2016-07-12 NOTE — ED Notes (Signed)
Pt leaving for [facility] ED at this time; pt alert, oriented; reports improved pain since initial arrival. Pt ambulating without difficulty. Accompanied by wife. Verbalized understanding of directions to [facility] ED and to ask for Patty, the charge RN, on arrival. [facility]

## 2016-07-12 NOTE — ED Notes (Signed)
Dr. Gross paged 

## 2016-07-12 NOTE — ED Notes (Signed)
Pt ambulating without assistance, holding his side.

## 2016-07-12 NOTE — ED Provider Notes (Signed)
[facility] He was driven by his wife and ambulatory.Dr. Judd Lienelo has spoken to Dr. Michaell CowingGross who is aware of this patient. He is currently stable and in no acute distress. Denies nausea at this time or need for pain medications. He is non-toxic appearing.   Albert ShoreJessica B Rorik Vespa, PA-C 07/12/16 1441    Donnetta HutchingBrian Cook, MD 07/13/16 [phone]

## 2016-07-12 NOTE — Anesthesia Procedure Notes (Signed)
Procedure Name: Intubation Date/Time: 07/12/2016 3:56 PM Performed by: Lissa Morales Pre-anesthesia Checklist: Patient identified, Emergency Drugs available, Suction available and Patient being monitored Patient Re-evaluated:Patient Re-evaluated prior to inductionOxygen Delivery Method: Circle system utilized Preoxygenation: Pre-oxygenation with 100% oxygen Intubation Type: IV induction Ventilation: Mask ventilation without difficulty Laryngoscope Size: Mac and 4 Tube type: Oral Tube size: 8.0 mm Number of attempts: 1 Airway Equipment and Method: Stylet and Oral airway Placement Confirmation: ETT inserted through vocal cords under direct vision,  positive ETCO2 and breath sounds checked- equal and bilateral Secured at: 21 cm Tube secured with: Tape Dental Injury: Teeth and Oropharynx as per pre-operative assessment

## 2016-07-12 NOTE — Op Note (Signed)
PATIENT:  Albert Jones  36 y.o. male  Patient Care Team: Loyal Jacobson, MD as PCP - General (Family Medicine)  PRE-OPERATIVE DIAGNOSIS:    Appendicitis Incarcerated supraumbilical hernia  POST-OPERATIVE DIAGNOSIS:    Perforated GANGRENOUS appendicitis SUPRAUMBILICAL INCARCERATED HERNIA  PROCEDURE:    LAPAROSCOPIC APPENDECTOMY LAPAROSCOPIC LYSIS OF ADHESIONS OMENTOPEXY OF APPENDICEAL STUMP INCARCERATED SUPRAUMBILICAL HERNIA REPAIR  SURGEON:  Ardeth Sportsman, MD  ANESTHESIA:   local and general  EBL:  Total I/O In: 2000 [I.V.:2000] Out: 475 [Urine:450; Blood:25]  Delay start of Pharmacological VTE agent (>24hrs) due to surgical blood loss or risk of bleeding:  no  DRAINS: none   SPECIMEN:  Source of Specimen:  APPENDIX  DISPOSITION OF SPECIMEN:  PATHOLOGY  COUNTS:  YES  PLAN OF CARE: Admit to inpatient   PATIENT DISPOSITION:  PACU - hemodynamically stable.   INDICATIONS: Patient with concerning symptoms & work up suspicious for appendicitis.  Surgery was recommended:  The anatomy & physiology of the digestive tract was discussed.  The pathophysiology of appendicitis was discussed.  Natural history risks without surgery was discussed.   I feel the risks of no intervention will lead to serious problems that outweigh the operative risks; therefore, I recommended diagnostic laparoscopy with removal of appendix to remove the pathology.  Laparoscopic & open techniques were discussed.   I noted a good likelihood this will help address the problem.    Risks such as bleeding, infection, abscess, leak, reoperation, possible ostomy, hernia, heart attack, death, and other risks were discussed.  Goals of post-operative recovery were discussed as well.  We will work to minimize complications.  Questions were answered.  The patient expresses understanding & wishes to proceed with surgery.  OR FINDINGS:   Retrocolic appendix with perforation and focal peritonitis.  Most of it  gangrene.  Proximal perforation.  ligated with PDS Endoloop with an omentopexy of healthy epiploic appendages and mesentery to the appendiceal base and cecum and distal ileum  Supraumbilical epigastric hernia 15 mm incarcerated with omentum.  Reduced and primarily repaired  DESCRIPTION:   The patient was identified & brought into the operating room. The patient was positioned supine with arms tucked. SCDs were active during the entire case. The patient underwent general anesthesia without any difficulty.  The abdomen was prepped and draped in a sterile fashion. A Surgical Timeout confirmed our plan.  I made a transverse incision through the superior umbilical fold.  I made a small transverse nick through the infraumbilical fascia and confirmed peritoneal entry.  I placed a 5mm port.  We induced carbon dioxide insufflation.  Camera inspection revealed no injury.  Seven find a swath of omentum going up to the falciform ligament enters super umbilical epigastric hernia.  I was able to gradually reduce that down.  I was able to place a 12 mm port through that herniaI placed additional ports under direct laparoscopic visualization.  I had to do some lysis adhesions to free some greater omentum off the lower abdomen to have adequate visualization.  Eventually mobilized the greater omentum up into the upper abdomen.  The terminal ileum and ascending colon showed evidence of inflammation with dense adhesions to the right paracolic gutter flank sidewall.  I mobilized the terminal ileum to proximal ascending colon in a lateral to medial fashion.  Retroperitoneal attachments were very inflamed and concrete.  The whole right colon was rather stuck.  Therefore had to mobilize the proximal transverse colon and hepatic flexure and a superior to inferior fashion to  better mobilize the right colon and enter into the region in a less inflamed right superior retro-mesenteric plane..  Eventually was able to free the right  colon off the retroperitoneum.  Very dense concrete-like adhesions to the right kidney and right paracolic gutter.  Found a pocket of phlegmon but down definite purulence.  Could see a region of necrosis consistent with gangrenous perforated appendicitis.  I took care to avoid injuring any retroperitoneal structures.    I was able to identify the terminal ileum and the fold of Trees on the distal antimesenteric serosa of the very distal ileum.  Started to transect through some of that to find the appendix and free it off the cecum and ascending colon.  Freed off the mesentery.  It was quite inflamed.  Eventually was able to lift it off and come down more towards the base.  The central part of the appendix was frankly gangrenous and nearly broke in half.  The tip was very inflamed but that actually the middle third coming more proximally was frankly gangrene.  .  Eventually I was able to mobilize it and skeletonized around its base. There was a thinned out 1 cm appendiceal stump cuff.  The cecum and its mesentery and terminal ileum were very inflamed.  Two thickened to tolerate trying to staple off.  Therefore I ligated with 0 PDS Endoloop at the appendiceal base coming out of the cecum.    I placed the appendix inside an EndoCatch bag and removed out the 12 mm port.  Eventually got all pieces out.  Copious irrigation of 5 L of isotonic solution.  Did this in all regions of the abdomen.  He cleared up rather well.  Hemostasis is good.  I reinspected the appendiceal stump.I got a decent ligation but I was skeptical that the tissues were great quality.  Therefore I used 2-0 Vicryl sutures on the cecum, epiploic appendages,and ileocecal mesentery and tied those down with interrupted stitches 3 to help pexy some healthy epiploic appendage pedicles & mesentery to cover and protect the appendiceal base for an omentopexy   I did more copious irrigation.  Because the area cleaned up well after irrigation there was no  major focused abscess cavity, I did not place a drain.  I brought the greater omentum down and after getting a properly mobilized at least it down towards the pelvis and right lower quadrant.  I primarily closed the superior helical incarcerated hernia with #1 PDS interrupted sutures transversely to good result.  I aspirated the carbon dioxide. I removed the ports. I closed the 12 mm fascia site using a 0 Vicryl stitch I closed skin using 4-0 monocryl stitch.  Patient was extubated and sent to the recovery room.  I discussed the operative findings with the patient's wife. I suspect the patient is going used in the hospital at least overnight and will need antibiotics for at least 5 days. Questions answered. She expressed understanding and appreciation.  Ardeth SportsmanSteven C. Vinisha Faxon, M.D., F.A.C.S. Gastrointestinal and Minimally Invasive Surgery [facility] [address] [place], [place] [id number] [phone] Main / Paging  07/12/2016 6:16 PM

## 2016-07-12 NOTE — Transfer of Care (Signed)
Immediate Anesthesia Transfer of Care Note  Patient: Albert Jones  Procedure(s) Performed: Procedure(s): APPENDECTOMY LAPAROSCOPIC, LAPAROSCOPIC LYSIS OF ADHESIONS, OMENTOPEXY OF STUMP, INCARCERATED SUPRAUMBILICAL HERNIA REPAIR (N/A)  Patient Location: PACU  Anesthesia Type:General  Level of Consciousness: awake, alert , oriented and patient cooperative  Airway & Oxygen Therapy: Patient Spontanous Breathing and Patient connected to face mask oxygen  Post-op Assessment: Report given to RN, Post -op Vital signs reviewed and stable and Patient moving all extremities X 4  Post vital signs: stable  Last Vitals:  Vitals:   07/12/16 1830 07/12/16 1834  BP: (!) 160/108 (!) 142/112  Pulse: (!) 101 93  Resp: (!) 23 17  Temp:      Last Pain:  Vitals:   07/12/16 1443  TempSrc: Oral  PainSc:          Complications: No apparent anesthesia complications

## 2016-07-13 ENCOUNTER — Inpatient Hospital Stay (HOSPITAL_COMMUNITY): Payer: BLUE CROSS/BLUE SHIELD

## 2016-07-13 LAB — CBC
HEMATOCRIT: 40.4 % (ref 39.0–52.0)
HEMOGLOBIN: 14.3 g/dL (ref 13.0–17.0)
MCH: 30.5 pg (ref 26.0–34.0)
MCHC: 35.4 g/dL (ref 30.0–36.0)
MCV: 86.1 fL (ref 78.0–100.0)
Platelets: 160 10*3/uL (ref 150–400)
RBC: 4.69 MIL/uL (ref 4.22–5.81)
RDW: 12.7 % (ref 11.5–15.5)
WBC: 13.6 10*3/uL — ABNORMAL HIGH (ref 4.0–10.5)

## 2016-07-13 LAB — POTASSIUM: Potassium: 3.8 mmol/L (ref 3.5–5.1)

## 2016-07-13 LAB — CREATININE, SERUM
Creatinine, Ser: 0.85 mg/dL (ref 0.61–1.24)
GFR calc Af Amer: >60 mL/min (ref >60)
GFR calc non Af Amer: >60 mL/min (ref >60)

## 2016-07-13 LAB — HIV ANTIBODY (ROUTINE TESTING W REFLEX): HIV Screen 4th Generation wRfx: NONREACTIVE

## 2016-07-13 LAB — MAGNESIUM: Magnesium: 1.7 mg/dL (ref 1.7–2.4)

## 2016-07-13 MED ORDER — PROMETHAZINE HCL 25 MG/ML IJ SOLN: 6.2500 mg | Freq: Four times a day (QID) | INTRAMUSCULAR | Status: DC | PRN

## 2016-07-13 MED ORDER — KCL IN DEXTROSE-NACL 20-5-0.9 MEQ/L-%-% IV SOLN
INTRAVENOUS | Status: DC
  Administered 2016-07-13 – 2016-07-14 (×3): via INTRAVENOUS
  Filled 2016-07-13 (×6): qty 1000

## 2016-07-13 MED ORDER — PROMETHAZINE HCL 25 MG/ML IJ SOLN
6.2500 mg | Freq: Four times a day (QID) | INTRAMUSCULAR | Status: DC | PRN
  Administered 2016-07-13 – 2016-07-14 (×2): 6.25 mg via INTRAVENOUS
  Filled 2016-07-13 (×2): qty 1

## 2016-07-13 NOTE — Anesthesia Postprocedure Evaluation (Signed)
Anesthesia Post Note  Patient: Albert Jones  Procedure(s) Performed: Procedure(s) (LRB): APPENDECTOMY LAPAROSCOPIC, LAPAROSCOPIC LYSIS OF ADHESIONS, OMENTOPEXY OF STUMP, INCARCERATED SUPRAUMBILICAL HERNIA REPAIR (N/A)  Patient location during evaluation: PACU Anesthesia Type: General Level of consciousness: awake and alert Pain management: pain level controlled Vital Signs Assessment: post-procedure vital signs reviewed and stable Respiratory status: spontaneous breathing, nonlabored ventilation, respiratory function stable and patient connected to nasal cannula oxygen Cardiovascular status: blood pressure returned to baseline and stable Postop Assessment: no signs of nausea or vomiting Anesthetic complications: no       Last Vitals:  Vitals:   07/13/16 0108 07/13/16 0605  BP: (!) 154/88 (!) 150/99  Pulse: (!) 108 (!) 105  Resp: 18 20  Temp: 37.2 C 37.3 C    Last Pain:  Vitals:   07/13/16 0605  TempSrc: Oral  PainSc:                  [name] DAVID

## 2016-07-13 NOTE — Progress Notes (Signed)
1 Day Post-Op  Subjective: A little nausea with liquids this am. Waiting on pain med. No flatus. Some RLQ pain.   Objective: Vital signs in last 24 hours: Temp:  [98 F (36.7 C)-99.1 F (37.3 C)] 99.1 F (37.3 C) (03/19 0605) Pulse Rate:  [86-108] 105 (03/19 0605) Resp:  [13-23] 20 (03/19 0605) BP: (126-166)/(80-112) 150/99 (03/19 0605) SpO2:  [89 %-99 %] 93 % (03/19 0605) Weight:  [95.3 kg (210 lb)] 95.3 kg (210 lb) (03/18 0951) Last BM Date: 07/12/16  Intake/Output from previous day: 03/18 0701 - 03/19 0700 In: 4005 [P.O.:400; I.V.:3355; IV Piggyback:250] Out: 875 [Urine:850; Blood:25] Intake/Output this shift: No intake/output data recorded.  A little ill appearing Symmetric chest rise, nonlabored Obese, some distension, approp TTP, dressing c/d/I No edema  Lab Results:   Recent Labs  07/12/16 1100 07/13/16 0547  WBC 13.8* 13.6*  HGB 16.1 14.3  HCT 45.1 40.4  PLT 157 160   BMET  Recent Labs  07/12/16 1100 07/13/16 0547  NA 137  --   K 3.6 3.8  CL 105  --   CO2 22  --   GLUCOSE 126*  --   BUN 12  --   CREATININE 0.84 0.85  CALCIUM 9.1  --    PT/INR No results for input(s): LABPROT, INR in the last 72 hours. ABG No results for input(s): PHART, HCO3 in the last 72 hours.  Invalid input(s): PCO2, PO2  Studies/Results: Ct Abdomen Pelvis W Contrast  Result Date: 07/12/2016 CLINICAL DATA:  Right-sided abdominal pain 5 days with nausea and some vomiting. Diaphoresis today. Elevated white blood cell count. EXAM: CT ABDOMEN AND PELVIS WITH CONTRAST TECHNIQUE: Multidetector CT imaging of the abdomen and pelvis was performed using the standard protocol following bolus administration of intravenous contrast. CONTRAST:  100mL ISOVUE-300 IOPAMIDOL (ISOVUE-300) INJECTION 61% COMPARISON:  08/03/2011 FINDINGS: Lower chest: Bibasilar linear atelectasis. Calcified granuloma over the right lower lobe. Hepatobiliary: Within normal. Pancreas: Within normal. Spleen: Within  normal. Adrenals/Urinary Tract: Adrenal glands are normal. Kidneys are normal in size without hydronephrosis or nephrolithiasis. Ureters and bladder are normal. Stomach/Bowel: Stomach is normal. Small bowel is unremarkable. Colon is within normal. There is a moderately inflamed appendix in the right lower quadrant in the retrocecal location with appendicolith at the tip. There is adjacent inflammatory change and small amount of adjacent free fluid. No evidence of perforation or abscess. Few small adjacent mesenteric lymph nodes. Vascular/Lymphatic: Within normal. Reproductive: Within normal. Other: Suggestion of a tiny and ventral hernia over the midline upper abdomen containing only cement are mesocolon mesenteric fat. Musculoskeletal: Within normal. IMPRESSION: Evidence of acute appendicitis. No perforation or abscess. Appendix is retrocecal in location. Bibasilar linear atelectasis. Tiny midline ventral hernia over the upper abdomen containing only mesenteric fat. Critical Value/emergent results were called by telephone at the time of interpretation on 07/12/2016 at 12:34 pm to Dr. Geoffery LyonsUGLAS DELO , who verbally acknowledged these results. Electronically Signed   By: Elberta Fortisaniel  Boyle M.D.   On: 07/12/2016 12:34    Anti-infectives: Anti-infectives    Start     Dose/Rate Route Frequency Ordered Stop   07/12/16 2045  piperacillin-tazobactam (ZOSYN) IVPB 3.375 g     3.375 g 12.5 mL/hr over 240 Minutes Intravenous Every 8 hours 07/12/16 2039     07/12/16 1315  cefTRIAXone (ROCEPHIN) 1 g in dextrose 5 % 50 mL IVPB  Status:  Discontinued     1 g 100 mL/hr over 30 Minutes Intravenous  Once 07/12/16 1303 07/12/16 1338  07/12/16 1315  metroNIDAZOLE (FLAGYL) IVPB 500 mg  Status:  Discontinued     500 mg 100 mL/hr over 60 Minutes Intravenous  Once 07/12/16 1303 07/12/16 1338   07/12/16 1315  cefTRIAXone (ROCEPHIN) 2 g in dextrose 5 % 50 mL IVPB     2 g 100 mL/hr over 30 Minutes Intravenous Every 24 hours 07/12/16  1307 07/12/16 1517   07/12/16 1315  metroNIDAZOLE (FLAGYL) IVPB 500 mg     500 mg 100 mL/hr over 60 Minutes Intravenous Every 8 hours 07/12/16 1307        Assessment/Plan: s/p Procedure(s): APPENDECTOMY LAPAROSCOPIC, LAPAROSCOPIC LYSIS OF ADHESIONS, OMENTOPEXY OF STUMP, INCARCERATED SUPRAUMBILICAL HERNIA REPAIR (N/A) By Dr Michaell Cowing  rediscussed intraop findings Keep on liquids as tolerated Chemical vte prophylaxis Ambulate, pulm toilet, IS Cont IV abx for 5 days given perforation  Mary Sella. Andrey Campanile, MD, FACS General, Bariatric, & Minimally Invasive Surgery [facility], [place]   LOS: 1 day    Atilano Ina 07/13/2016

## 2016-07-13 NOTE — Progress Notes (Signed)
Patient vomited even after nausea medicine given. Also, patient remaining sinus tachy in the 105-115 range. MD Notified. New orders in EPIC. Continue to monitor.

## 2016-07-13 NOTE — Progress Notes (Signed)
Pt vomiting earlier with PO's.  Film obtained and it shModerate gaseous distention of small and large bowel, suggesting adynamic ileus. I have made him NPO, except ice chips. LR not compatible with the Zosyn and Reglan, so I changed his IV fluids, and increased the rate.   PE:  He is distended and no BS now.  Nausea better with IV phenergan.  If he get sick again we will insert an NG.

## 2016-07-14 LAB — C DIFFICILE QUICK SCREEN W PCR REFLEX
C DIFFICLE (CDIFF) ANTIGEN: NEGATIVE
C Diff interpretation: NOT DETECTED
C Diff toxin: NEGATIVE

## 2016-07-14 LAB — BASIC METABOLIC PANEL
Anion gap: 5 (ref 5–15)
BUN: 13 mg/dL (ref 6–20)
CHLORIDE: 106 mmol/L (ref 101–111)
CO2: 27 mmol/L (ref 22–32)
Calcium: 8.7 mg/dL — ABNORMAL LOW (ref 8.9–10.3)
Creatinine, Ser: 0.82 mg/dL (ref 0.61–1.24)
GFR calc Af Amer: >60 mL/min (ref >60)
GFR calc non Af Amer: >60 mL/min (ref >60)
GLUCOSE: 143 mg/dL — AB (ref 65–99)
POTASSIUM: 3.8 mmol/L (ref 3.5–5.1)
SODIUM: 138 mmol/L (ref 135–145)

## 2016-07-14 NOTE — Care Management Note (Signed)
Case Management Note  Patient Details  Name: [place]had Rathje MRN: [id number] Date of Birth: 08/22/1980  Subjective/Objective:  Ileus s/p lap. appy.                  Action/Plan: home Date:  July 14, 2016 Chart reviewed for concurrent status and case management needs. Will continue to follow patient progress. Discharge Planning: following for needs Expected discharge date: [id number] Marcelle SmilingRhonda Davis, BSN, [place], [place]   [phone]   Expected Discharge Date:  07/13/16               Expected Discharge Plan:  Home/Self Care  In-House Referral:     Discharge planning Services     Post Acute Care Choice:    Choice offered to:     DME Arranged:    DME Agency:     HH Arranged:    HH Agency:     Status of Service:  In process, will continue to follow  If discussed at Long Length of Stay Meetings, dates discussed:    Additional Comments:  Golda AcreDavis, Rhonda Lynn, RN 07/14/2016, 10:30 AM

## 2016-07-14 NOTE — Progress Notes (Signed)
Gave male visitor clean underwear from clothes closet.  Beryl Meageratina G. Marella Vanderpol, Chaplain

## 2016-07-14 NOTE — Progress Notes (Signed)
2 Days Post-Op  Subjective: Less nausea/bloating; had multiple BMs overnight. Still tender in RLQ  Objective: Vital signs in last 24 hours: Temp:  [98.1 F (36.7 C)-98.7 F (37.1 C)] 98.7 F (37.1 C) (03/20 0548) Pulse Rate:  [99-109] 101 (03/20 0548) Resp:  [18-20] 18 (03/20 0548) BP: (136-148)/(87-94) 138/93 (03/20 0548) SpO2:  [92 %-97 %] 95 % (03/20 0548) Last BM Date: 07/14/16  Intake/Output from previous day: 03/19 0701 - 03/20 0700 In: 1766.7 [I.V.:1466.7; IV Piggyback:300] Out: -  Intake/Output this shift: No intake/output data recorded.  Resting comfortably. [facility] less ill appearing today cta b/l Mild tachy Distension (much less today), soft, +BS, approp TTP in RLQ No edema  Lab Results:   Recent Labs  07/12/16 1100 07/13/16 0547  WBC 13.8* 13.6*  HGB 16.1 14.3  HCT 45.1 40.4  PLT 157 160   BMET  Recent Labs  07/12/16 1100 07/13/16 0547 07/14/16 0529  NA 137  --  138  K 3.6 3.8 3.8  CL 105  --  106  CO2 22  --  27  GLUCOSE 126*  --  143*  BUN 12  --  13  CREATININE 0.84 0.85 0.82  CALCIUM 9.1  --  8.7*   PT/INR No results for input(s): LABPROT, INR in the last 72 hours. ABG No results for input(s): PHART, HCO3 in the last 72 hours.  Invalid input(s): PCO2, PO2  Studies/Results: Ct Abdomen Pelvis W Contrast  Result Date: 07/12/2016 CLINICAL DATA:  Right-sided abdominal pain 5 days with nausea and some vomiting. Diaphoresis today. Elevated white blood cell count. EXAM: CT ABDOMEN AND PELVIS WITH CONTRAST TECHNIQUE: Multidetector CT imaging of the abdomen and pelvis was performed using the standard protocol following bolus administration of intravenous contrast. CONTRAST:  100mL ISOVUE-300 IOPAMIDOL (ISOVUE-300) INJECTION 61% COMPARISON:  08/03/2011 FINDINGS: Lower chest: Bibasilar linear atelectasis. Calcified granuloma over the right lower lobe. Hepatobiliary: Within normal. Pancreas: Within normal. Spleen: Within normal.  Adrenals/Urinary Tract: Adrenal glands are normal. Kidneys are normal in size without hydronephrosis or nephrolithiasis. Ureters and bladder are normal. Stomach/Bowel: Stomach is normal. Small bowel is unremarkable. Colon is within normal. There is a moderately inflamed appendix in the right lower quadrant in the retrocecal location with appendicolith at the tip. There is adjacent inflammatory change and small amount of adjacent free fluid. No evidence of perforation or abscess. Few small adjacent mesenteric lymph nodes. Vascular/Lymphatic: Within normal. Reproductive: Within normal. Other: Suggestion of a tiny and ventral hernia over the midline upper abdomen containing only cement are mesocolon mesenteric fat. Musculoskeletal: Within normal. IMPRESSION: Evidence of acute appendicitis. No perforation or abscess. Appendix is retrocecal in location. Bibasilar linear atelectasis. Tiny midline ventral hernia over the upper abdomen containing only mesenteric fat. Critical Value/emergent results were called by telephone at the time of interpretation on 07/12/2016 at 12:34 pm to Dr. Geoffery LyonsUGLAS DELO , who verbally acknowledged these results. Electronically Signed   By: Elberta Fortisaniel  Boyle M.D.   On: 07/12/2016 12:34   Dg Abd Portable 1v  Result Date: 07/13/2016 CLINICAL DATA:  Status post appendectomy insert a, right lower quadrant pain and nausea, evaluate for ileus EXAM: PORTABLE ABDOMEN - 1 VIEW COMPARISON:  CT abdomen/pelvis dated 07/12/2016 FINDINGS: Moderate gaseous distention of small and large bowel, suggesting adynamic ileus. Visualized osseous structures are within normal limits. IMPRESSION: Suspected adynamic ileus. Electronically Signed   By: Charline BillsSriyesh  Krishnan M.D.   On: 07/13/2016 15:53    Anti-infectives: Anti-infectives    Start  Dose/Rate Route Frequency Ordered Stop   07/12/16 2045  piperacillin-tazobactam (ZOSYN) IVPB 3.375 g     3.375 g 12.5 mL/hr over 240 Minutes Intravenous Every 8 hours 07/12/16  2039     07/12/16 1315  cefTRIAXone (ROCEPHIN) 1 g in dextrose 5 % 50 mL IVPB  Status:  Discontinued     1 g 100 mL/hr over 30 Minutes Intravenous  Once 07/12/16 1303 07/12/16 1338   07/12/16 1315  metroNIDAZOLE (FLAGYL) IVPB 500 mg  Status:  Discontinued     500 mg 100 mL/hr over 60 Minutes Intravenous  Once 07/12/16 1303 07/12/16 1338   07/12/16 1315  cefTRIAXone (ROCEPHIN) 2 g in dextrose 5 % 50 mL IVPB     2 g 100 mL/hr over 30 Minutes Intravenous Every 24 hours 07/12/16 1307 07/12/16 1517   07/12/16 1315  metroNIDAZOLE (FLAGYL) IVPB 500 mg  Status:  Discontinued     500 mg 100 mL/hr over 60 Minutes Intravenous Every 8 hours 07/12/16 1307 07/14/16 0855      Assessment/Plan: s/p Procedure(s): APPENDECTOMY LAPAROSCOPIC, LAPAROSCOPIC LYSIS OF ADHESIONS, OMENTOPEXY OF STUMP, INCARCERATED SUPRAUMBILICAL HERNIA REPAIR (N/A) POD 2 Dr Michaell Cowing  Cont IV abx, dc flagyl; 5 days total  Less bloating today, start clears, told pt to take slowly Ambulate, pulm toilet Cont chemical vte prophylaxis  Mary Sella. Andrey Campanile, MD, FACS General, Bariatric, & Minimally Invasive Surgery [facility], [place]    LOS: 2 days    Atilano Ina 07/14/2016

## 2016-07-15 ENCOUNTER — Encounter (HOSPITAL_COMMUNITY): Payer: Self-pay | Admitting: Surgery

## 2016-07-15 LAB — BASIC METABOLIC PANEL
ANION GAP: 8 (ref 5–15)
BUN: 9 mg/dL (ref 6–20)
CHLORIDE: 110 mmol/L (ref 101–111)
CO2: 21 mmol/L — AB (ref 22–32)
Calcium: 8.7 mg/dL — ABNORMAL LOW (ref 8.9–10.3)
Creatinine, Ser: 0.78 mg/dL (ref 0.61–1.24)
GFR calc Af Amer: >60 mL/min (ref >60)
GLUCOSE: 124 mg/dL — AB (ref 65–99)
POTASSIUM: 3.6 mmol/L (ref 3.5–5.1)
Sodium: 139 mmol/L (ref 135–145)

## 2016-07-15 LAB — MAGNESIUM: Magnesium: 1.9 mg/dL (ref 1.7–2.4)

## 2016-07-15 MED ORDER — METHOCARBAMOL 500 MG PO TABS: 1000.0000 mg | ORAL_TABLET | Freq: Three times a day (TID) | ORAL | Status: DC | PRN

## 2016-07-15 MED ORDER — IBUPROFEN 200 MG PO TABS: 600.0000 mg | ORAL_TABLET | Freq: Four times a day (QID) | ORAL | Status: DC | PRN

## 2016-07-15 NOTE — Progress Notes (Signed)
3 Days Post-Op  Subjective: He looks good, having loose stools.  Tolerating clears well.  Sites all look good.  Moving up in halls.    Objective: Vital signs in last 24 hours: Temp:  [98.4 F (36.9 C)-99.5 F (37.5 C)] 98.4 F (36.9 C) (03/21 0440) Pulse Rate:  [86-104] 86 (03/21 0440) Resp:  [16-20] 20 (03/21 0440) BP: (132-148)/(88-98) 141/89 (03/21 0440) SpO2:  [93 %-96 %] 96 % (03/21 0440) Last BM Date: 07/15/16 NO I/O Afebrile, VSS BMP OK no CBC  Intake/Output from previous day: No intake/output data recorded. Intake/Output this shift: No intake/output data recorded.  General appearance: alert, cooperative and no distress Resp: clear to auscultation bilaterally GI: soft, still a little distended, wife says he's at baseline.  + BS, + flatus and + loose stools.  Lab Results:   Recent Labs  07/12/16 1100 07/13/16 0547  WBC 13.8* 13.6*  HGB 16.1 14.3  HCT 45.1 40.4  PLT 157 160    BMET  Recent Labs  07/14/16 0529 07/15/16 0603  NA 138 139  K 3.8 3.6  CL 106 110  CO2 27 21*  GLUCOSE 143* 124*  BUN 13 9  CREATININE 0.82 0.78  CALCIUM 8.7* 8.7*   PT/INR No results for input(s): LABPROT, INR in the last 72 hours.   Recent Labs Lab 07/12/16 1100  AST 16  ALT 18  ALKPHOS 73  BILITOT 2.4*  PROT 7.9  ALBUMIN 3.9     Lipase  No results found for: LIPASE   Studies/Results: Dg Abd Portable 1v  Result Date: 07/13/2016 CLINICAL DATA:  Status post appendectomy insert a, right lower quadrant pain and nausea, evaluate for ileus EXAM: PORTABLE ABDOMEN - 1 VIEW COMPARISON:  CT abdomen/pelvis dated 07/12/2016 FINDINGS: Moderate gaseous distention of small and large bowel, suggesting adynamic ileus. Visualized osseous structures are within normal limits. IMPRESSION: Suspected adynamic ileus. Electronically Signed   By: Charline BillsSriyesh  Krishnan M.D.   On: 07/13/2016 15:53   Prior to Admission medications   Medication Sig Start Date End Date Taking? Authorizing  Provider  ibuprofen (ADVIL,MOTRIN) 200 MG tablet Take 200 mg by mouth every 6 (six) hours as needed.   Yes Historical Provider, MD  Ibuprofen-Diphenhydramine Cit (ADVIL PM PO) Take 2 tablets by mouth at bedtime.   Yes Historical Provider, MD  traMADol (ULTRAM) 50 MG tablet Take 1-2 tablets (50-100 mg total) by mouth every 6 (six) hours as needed for moderate pain or severe pain. 07/12/16   Karie SodaSteven Gross, MD    Medications: . enoxaparin (LOVENOX) injection  40 mg Subcutaneous Q24H  . lip balm  1 application Topical BID  . piperacillin-tazobactam (ZOSYN)  IV  3.375 g Intravenous Q8H  . saccharomyces boulardii  250 mg Oral BID   . dextrose 5 % and 0.9 % NaCl with KCl 20 mEq/L 125 mL/hr at 07/14/16 1824    Assessment/Plan Perforated GANGRENOUS appendicitis,SUPRAUMBILICAL INCARCERATED HERNIA s/p LAPAROSCOPIC APPENDECTOMY, LAPAROSCOPIC LYSIS OF ADHESIONS, OMENTOPEXY OF APPENDICEAL STUMP, INCARCERATED SUPRAUMBILICAL HERNIA REPAIR, 07/12/16, Dr.Steven Gross FEN:  IV fluids/Clears ID: Zosyn 07/12/16 =>> day 4 DVT:  Lovenox  Plan:  Advance diet, continue abx, lower IV rate.  Hopefully home tomorrow.  Recheck labs in AM.         LOS: 3 days    Albert Jones 07/15/2016 [phone] \

## 2016-07-15 NOTE — Discharge Summary (Signed)
Physician Discharge Summary  Patient ID: Albert Jones MRN: [id number] DOB/AGE: [birth year] 36 y.o.  Admit date: 07/12/2016 Discharge date: 07/16/2016  Admission Diagnoses:  Acute appendicitis Incarcerated Epigastric hernia  Discharge Diagnoses:  Perforated GANGRENOUS appendicitis, SUPRAUMBILICAL INCARCERATED HERNIA  Principal Problem:   Acute gangrenous appendicitis s/p lap appendectomy & omentopexy 07/12/2016 Active Problems:   Incarcerated epigastric hernia s/p primary repair 07/12/2016   PROCEDURES: LAPAROSCOPIC APPENDECTOMY, LAPAROSCOPIC LYSIS OF ADHESIONS, OMENTOPEXY OF APPENDICEAL STUMP, INCARCERATED SUPRAUMBILICAL HERNIA REPAIR, 07/12/16, Dr.Steven Gross  Hospital Course:  36 year old healthy male transferred to [facility] from [facility] with 4 days of abdominal pain and diarrhea.  Workup showed appendicitis. Patient was admitted and underwent the procedure listed above. Tolerated procedure well and was transferred to the floor. Diet was slowly advanced as tolerated.  On POD4 the patient was voiding well, tolerating diet, ambulating well, pain well controlled, vital signs stable, incisions c/d/i and felt stable for discharge home.  Patient will follow up in our office in 2 weeks and knows to call with questions or concerns.      I have personally reviewed the patients medication history on the [place] controlled substance database.   I was not involved in this patient's care therefore the information in this discharge summary was taken from the chart.   Discharge Instructions    Call MD for:    Complete by:  As directed    FEVER > 101.5 F  (temperatures < 101.5 F are not significant)   Call MD for:  extreme fatigue    Complete by:  As directed    Call MD for:  persistant dizziness or light-headedness    Complete by:  As directed    Call MD for:  persistant nausea and vomiting    Complete by:  As directed    Call MD for:  redness, tenderness, or signs of infection (pain, swelling, redness,  odor or green/yellow discharge around incision site)    Complete by:  As directed    Call MD for:  severe uncontrolled pain    Complete by:  As directed    Diet - low sodium heart healthy    Complete by:  As directed    Follow a light diet the first few days at home.   Start with a bland diet such as soups, liquids, starchy foods, low fat foods, etc.   If you feel full, bloated, or constipated, stay on a full liquid or pureed/blenderized diet for a few days until you feel better and no longer constipated. Be sure to drink plenty of fluids every day to avoid getting dehydrated (feeling dizzy, not urinating, etc.). Gradually add a fiber supplement to your diet   Discharge instructions    Complete by:  As directed    See Discharge Instructions If you are not getting better after two weeks or are noticing you are getting worse, contact our office [phone] for further advice.  We may need to adjust your medications, re-evaluate you in the office, send you to the emergency room, or see what other things we can do to help. The clinic staff is available to answer your questions during regular business hours (8:30am-5pm).  Please don't hesitate to call and ask to speak to one of our nurses for clinical concerns.    A surgeon from [facility] is always on call at the hospitals 24 hours/day If you have a medical emergency, go to the nearest emergency room or call 911.   Discharge patient  Complete by:  As directed    Discharge disposition:  01-Home or Self Care   Discharge patient date:  07/16/2016   Driving Restrictions    Complete by:  As directed    You may drive when you are no longer taking narcotic prescription pain medication, you can comfortably wear a seatbelt, and you can safely make sudden turns/stops to protect yourself without hesitating due to pain.   Increase activity slowly    Complete by:  As directed    Start light daily activities --- self-care, walking,  climbing stairs- beginning the day after surgery.  Gradually increase activities as tolerated.  Control your pain to be active.  Stop when you are tired.  Ideally, walk several times a day, eventually an hour a day.   Most people are back to most day-to-day activities in a few weeks.  It takes 4-8 weeks to get back to unrestricted, intense activity. If you can walk 30 minutes without difficulty, it is safe to try more intense activity such as jogging, treadmill, bicycling, low-impact aerobics, swimming, etc. Save the most intensive and strenuous activity for last (Usually 4-8 weeks after surgery) such as sit-ups, heavy lifting, contact sports, etc.  Refrain from any intense heavy lifting or straining until you are off narcotics for pain control.  You will have off days, but things should improve week-by-week. DO NOT PUSH THROUGH PAIN.  Let pain be your guide: If it hurts to do something, don't do it.  Pain is your body warning you to avoid that activity for another week until the pain goes down.   Lifting restrictions    Complete by:  As directed    If you can walk 30 minutes without difficulty, it is safe to try more intense activity such as jogging, treadmill, bicycling, low-impact aerobics, swimming, etc. Save the most intensive and strenuous activity for last (Usually 4-8 weeks after surgery) such as sit-ups, heavy lifting, contact sports, etc.  Refrain from any intense heavy lifting or straining until you are off narcotics for pain control.  You will have off days, but things should improve week-by-week. DO NOT PUSH THROUGH PAIN.  Let pain be your guide: If it hurts to do something, don't do it.  Pain is your body warning you to avoid that activity for another week until the pain goes down.   May walk up steps    Complete by:  As directed    No wound care    Complete by:  As directed    It is good for closed incision and even open wounds to be washed every day.  Shower every day.  Short baths are  fine.  Wash the incisions and wounds clean with soap & water.    If you have a closed incision(s), wash the incision with soap & water every day.  You may leave closed incisions open to air if it is dry.   You may cover the incision with clean gauze & replace it after your daily shower for comfort. If you have skin tapes (Steristrips) or skin glue (Dermabond) on your incision, leave them in place.  They will fall off on their own like a scab.  You may trim any edges that curl up with clean scissors.  If you have staples, set up an appointment for them to be removed in the office in 10 days after surgery.  If you have a drain, wash around the skin exit site with soap & water and place a  new dressing of gauze or band aid around the skin every day.  Keep the drain site clean & dry.   Sexual Activity Restrictions    Complete by:  As directed    You may have sexual intercourse when it is comfortable. If it hurts to do something, stop.     Allergies as of 07/16/2016   No Known Allergies     Medication List    TAKE these medications   ADVIL PM PO Take 2 tablets by mouth at bedtime.   amoxicillin-clavulanate 875-125 MG tablet Commonly known as:  AUGMENTIN Take 1 tablet by mouth 2 (two) times daily.   HYDROcodone-acetaminophen 5-325 MG tablet Commonly known as:  NORCO Take 1 tablet by mouth every 6 (six) hours as needed for moderate pain.   ibuprofen 200 MG tablet Commonly known as:  ADVIL,MOTRIN Take 200 mg by mouth every 6 (six) hours as needed.   ondansetron 4 MG disintegrating tablet Commonly known as:  ZOFRAN-ODT Take 1 tablet (4 mg total) by mouth every 6 (six) hours as needed for nausea.   ondansetron 4 MG/2ML Soln injection Commonly known as:  ZOFRAN Inject 2 mLs (4 mg total) into the vein every 6 (six) hours as needed for nausea or vomiting.   saccharomyces boulardii 250 MG capsule Commonly known as:  FLORASTOR Take 1 capsule (250 mg total) by mouth 2 (two) times daily. You  can get a probiotic over the counter      Follow-up Information    [facility] Follow up on 08/11/2016.   Specialty:  General Surgery Why:  your appointment is at 10:30 AM, be at the office 30 minutes  Contact information: [address] [place] [address] [phone]           Signed: Sherrie George 07/15/2016, 3:42 PM

## 2016-07-16 LAB — CBC
HEMATOCRIT: 40.2 % (ref 39.0–52.0)
HEMOGLOBIN: 13.9 g/dL (ref 13.0–17.0)
MCH: 30.2 pg (ref 26.0–34.0)
MCHC: 34.6 g/dL (ref 30.0–36.0)
MCV: 87.4 fL (ref 78.0–100.0)
Platelets: 190 10*3/uL (ref 150–400)
RBC: 4.6 MIL/uL (ref 4.22–5.81)
RDW: 12.6 % (ref 11.5–15.5)
WBC: 6.4 10*3/uL (ref 4.0–10.5)

## 2016-07-16 MED ORDER — SACCHAROMYCES BOULARDII 250 MG PO CAPS: 250.0000 mg | ORAL_CAPSULE | Freq: Two times a day (BID) | ORAL | Status: DC

## 2016-07-16 MED ORDER — ONDANSETRON 4 MG PO TBDP: 4.0000 mg | ORAL_TABLET | Freq: Four times a day (QID) | ORAL | 0 refills | Status: DC | PRN

## 2016-07-16 MED ORDER — AMOXICILLIN-POT CLAVULANATE 875-125 MG PO TABS: 1.0000 | ORAL_TABLET | Freq: Two times a day (BID) | ORAL | 0 refills | Status: DC

## 2016-07-16 MED ORDER — ONDANSETRON HCL 4 MG/2ML IJ SOLN: 4.0000 mg | Freq: Four times a day (QID) | INTRAMUSCULAR | 0 refills | Status: DC | PRN

## 2016-07-16 MED ORDER — HYDROCODONE-ACETAMINOPHEN 5-325 MG PO TABS: 1.0000 | ORAL_TABLET | Freq: Four times a day (QID) | ORAL | 0 refills | Status: DC | PRN

## 2016-07-16 NOTE — Progress Notes (Signed)
Date: July 16, 2016 Discharge orders review for case management needs.  None found Jahlil Ziller, BSN, RN3, CCM: [phone] 

## 2016-07-17 ENCOUNTER — Encounter (HOSPITAL_COMMUNITY): Payer: Self-pay | Admitting: Surgery

## 2018-09-15 IMAGING — DX DG ABD PORTABLE 1V
2 series · 2 of 2 positions shown · non-contrast
Comparison: CT abdomen/pelvis dated 07/12/2016

CLINICAL DATA: Status post appendectomy insert a, right lower
quadrant pain and nausea, evaluate for ileus

EXAM:
PORTABLE ABDOMEN - 1 VIEW

[abdomen kub (1 of 2)]
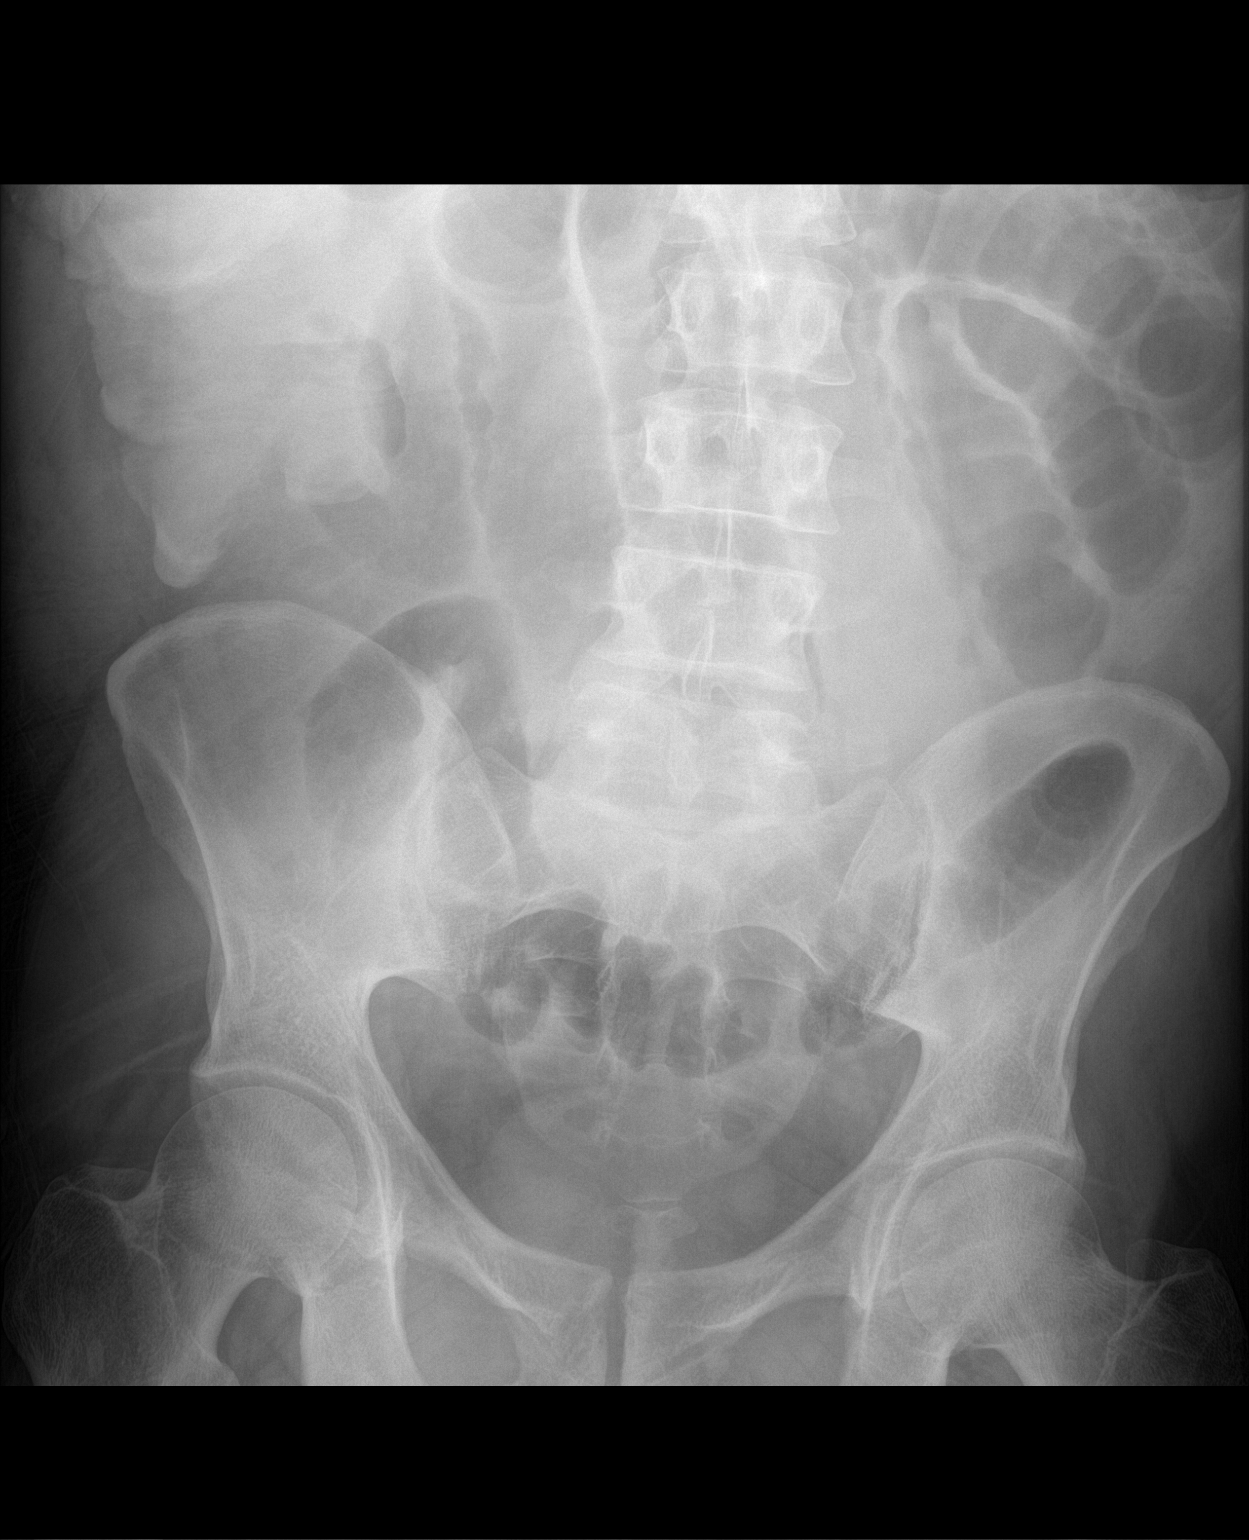

[abdomen kub (2 of 2)]
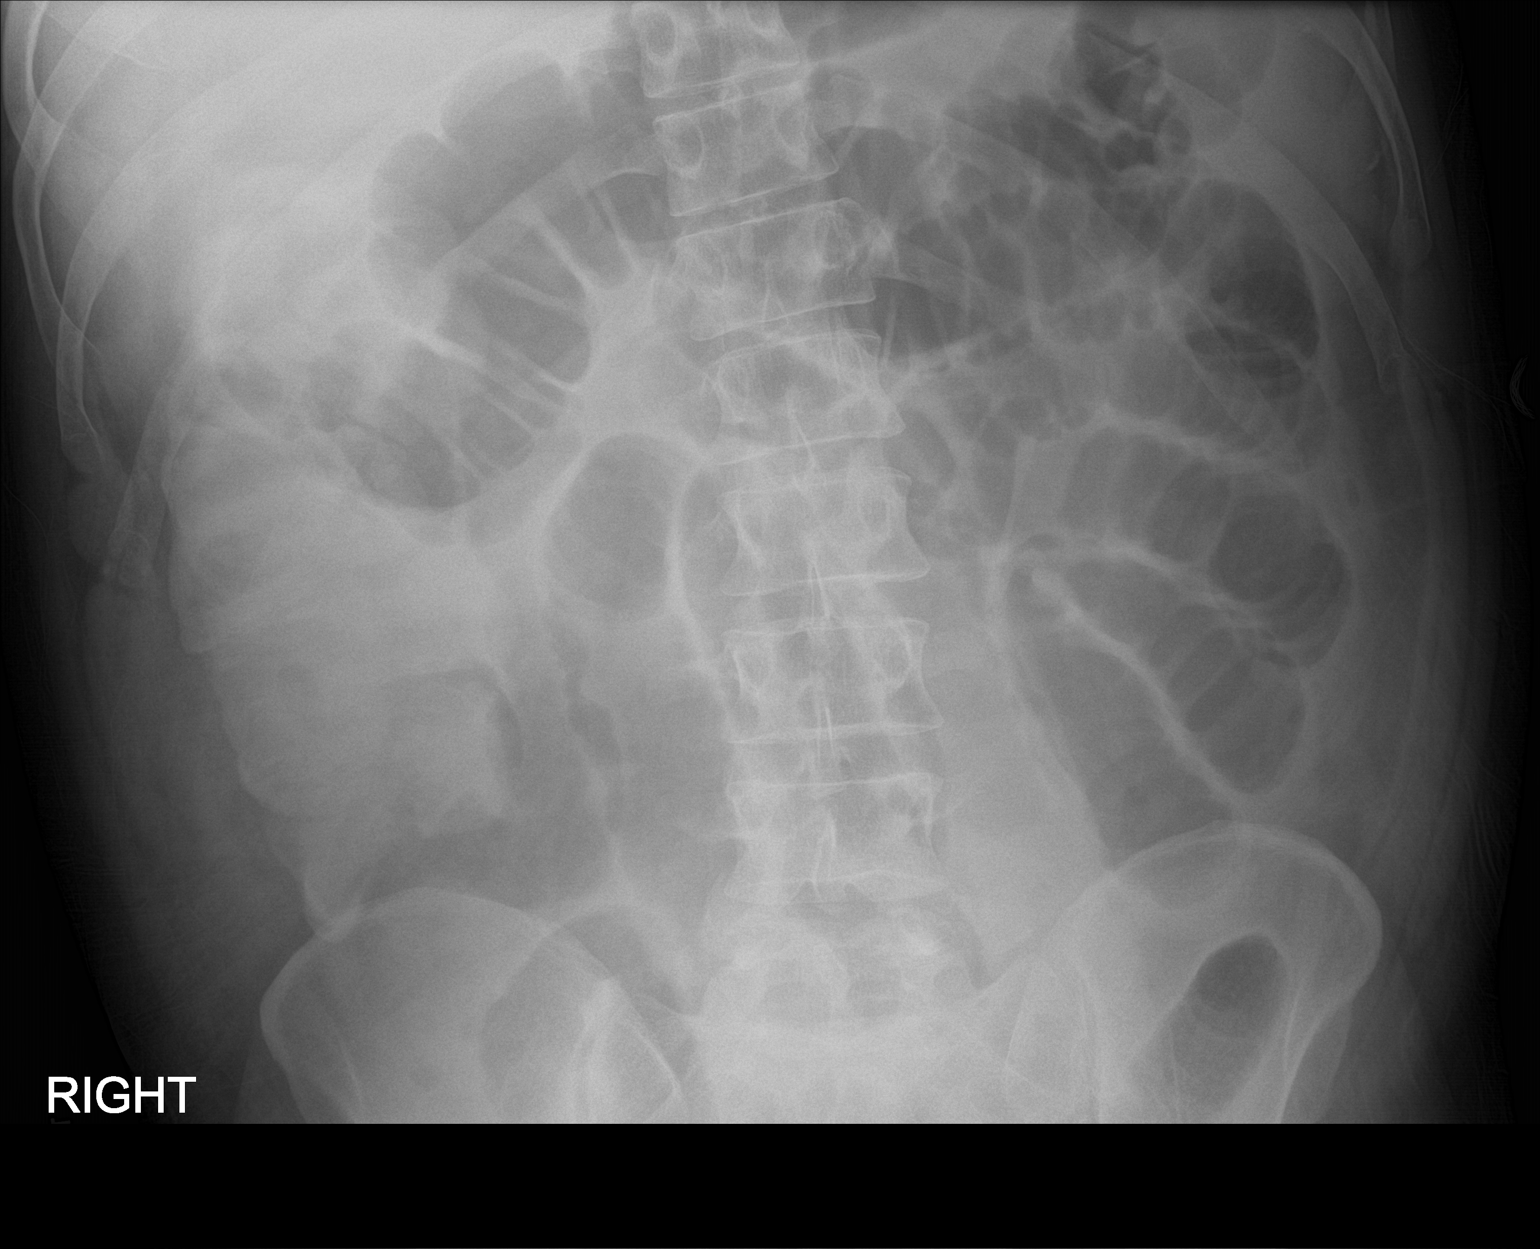

[2 of 2 positions shown; findings below may reference images not displayed]

FINDINGS: Moderate gaseous distention of small and large bowel, suggesting
adynamic ileus.

Visualized osseous structures are within normal limits.
IMPRESSION: Suspected adynamic ileus.

## 2020-05-31 ENCOUNTER — Other Ambulatory Visit: Payer: Self-pay

## 2020-05-31 ENCOUNTER — Ambulatory Visit (INDEPENDENT_AMBULATORY_CARE_PROVIDER_SITE_OTHER): Payer: 59 | Admitting: Nurse Practitioner

## 2020-05-31 ENCOUNTER — Encounter: Payer: Self-pay | Admitting: Nurse Practitioner

## 2020-05-31 VITALS — BP 132/86 | HR 80 | Temp 98.2°F | Ht 72.0 in | Wt 230.8 lb

## 2020-05-31 DIAGNOSIS — L989 Disorder of the skin and subcutaneous tissue, unspecified: Secondary | ICD-10-CM | POA: Diagnosis not present

## 2020-05-31 DIAGNOSIS — Z7689 Persons encountering health services in other specified circumstances: Secondary | ICD-10-CM

## 2020-05-31 DIAGNOSIS — Z0000 Encounter for general adult medical examination without abnormal findings: Secondary | ICD-10-CM | POA: Insufficient documentation

## 2020-05-31 DIAGNOSIS — Z0001 Encounter for general adult medical examination with abnormal findings: Secondary | ICD-10-CM | POA: Diagnosis not present

## 2020-05-31 LAB — CBC WITH DIFFERENTIAL/PLATELET
Basos: 2 %
EOS (ABSOLUTE): 0.1 10*3/uL (ref 0.0–0.4)
MCHC: 34.4 g/dL (ref 31.5–35.7)
MCV: 90 fL (ref 79–97)
Monocytes Absolute: 0.5 10*3/uL (ref 0.1–0.9)
Neutrophils Absolute: 4 10*3/uL (ref 1.4–7.0)
RBC: 5.33 x10E6/uL (ref 4.14–5.80)
RDW: 12.7 % (ref 11.6–15.4)

## 2020-05-31 LAB — LIPID PANEL

## 2020-05-31 LAB — COMPREHENSIVE METABOLIC PANEL

## 2020-05-31 NOTE — Assessment & Plan Note (Signed)
Lesion on left lateral side of inner knee.  Referral to dermatology completed.  Follow-up with worsening symptoms.

## 2020-05-31 NOTE — Progress Notes (Signed)
New Patient Note  RE: Albert Jones MRN: [id number] DOB: 1980/06/22 Date of Office Visit: 05/31/2020  Chief Complaint: Establish Care  History of Present Illness:  .   Encounter for general adult medical examination Physical: Patient's last physical exam was 1 year ago .  Weight: not appropriate for height (BMI is greater than 27%) ;  Blood Pressure: Normal (BP less than 120/80) ;  Medical History: Patient history reviewed ; Family history reviewed ;  Allergies Reviewed: No change in current allergies ;  Medications Reviewed: Medications reviewed - no changes ;  Lipids: Normal lipid levels ;  Smoking: Life-long non-smoker ;  Physical Activity: Exercises at least 3 times per week ;  Alcohol/Drug Use: is a social-drinker ; No illicit drug use ;    Safety: reviewed ; Patient wears a seat belt, has smoke detectors, has carbon monoxide detectors, practices appropriate gun safety, and wears sunscreen with extended sun exposure. Dental Care: biannual cleanings, brushes and flosses daily. Ophthalmology/Optometry: Annual visit.  Hearing loss: none Vision impairments: wears reading glasses  Assessment and Plan: Albert is a 40 y.o. male with: Skin lesion Lesion on left lateral side of inner knee.  Referral to dermatology completed.  Follow-up with worsening symptoms.  Annual physical exam Annual physical exam completed provided education for preventative and health maintenance.  Printed handouts given to patient. Labs completed-CBC, CMP, lipid panel,  Return in about 1 year (around 05/31/2021).   Diagnostics:   Past Medical History: Patient Active Problem List   Diagnosis Date Noted  . Annual physical exam 05/31/2020  . Skin lesion 05/31/2020  . Acute gangrenous appendicitis s/p lap appendectomy & omentopexy 07/12/2016 07/12/2016  . Incarcerated epigastric hernia s/p primary repair 07/12/2016 07/12/2016  . Excessive sweating 05/02/2015   No past medical history on file. Past  Surgical History: Past Surgical History:  Procedure Laterality Date  . HERNIA REPAIR    . LAPAROSCOPIC APPENDECTOMY N/A 07/12/2016   Procedure: APPENDECTOMY LAPAROSCOPIC, LAPAROSCOPIC LYSIS OF ADHESIONS, OMENTOPEXY OF STUMP, INCARCERATED SUPRAUMBILICAL HERNIA REPAIR;  Surgeon: Karie Soda, MD;  Location: [facility];  Service: General;  Laterality: N/A;   Medication List:  No current outpatient medications on file.   No current facility-administered medications for this visit.   Allergies: No Known Allergies Social History: Social History   Socioeconomic History  . Marital status: Married    Spouse name: Lelon Mast  . Number of children: Not on file  . Years of education: Not on file  . Highest education level: High school graduate  Occupational History  . Occupation: self employed  Tobacco Use  . Smoking status: Former Games developer  . Smokeless tobacco: Former Clinical biochemist  . Vaping Use: Never used  Substance and Sexual Activity  . Alcohol use: Yes    Alcohol/week: 2.0 standard drinks    Types: 2 Shots of liquor per week    Comment: occassionally  . Drug use: Not Currently    Frequency: 3.0 times per week    Types: Marijuana  . Sexual activity: Yes    Birth control/protection: None  Other Topics Concern  . Not on file  Social History Narrative  . Not on file   Social Determinants of Health   Financial Resource Strain: Not on file  Food Insecurity: Not on file  Transportation Needs: Not on file  Physical Activity: Not on file  Stress: Not on file  Social Connections: Not on file       Family History: Family History  Problem Relation Age  of Onset  . Multiple sclerosis Mother   . Heart disease Brother          Review of Systems  Constitutional: Negative.   HENT: Negative.   Eyes: Negative.   Respiratory: Negative.   Cardiovascular: Negative.   Gastrointestinal: Negative.   Neurological: Negative.   Psychiatric/Behavioral: Negative.   All other systems  reviewed and are negative.  Objective: BP 132/86   Pulse 80   Temp 98.2 F (36.8 C)   Ht 6' (1.829 m)   Wt 230 lb 12.8 oz (104.7 kg)   SpO2 95%   BMI 31.30 kg/m  Body mass index is 31.3 kg/m. Physical Exam Vitals reviewed.  Constitutional:      General: He is awake.     Appearance: Normal appearance. He is overweight.     Interventions: Face mask in place.  HENT:     Head: Normocephalic.     Nose: Nose normal.  Eyes:     Conjunctiva/sclera: Conjunctivae normal.  Cardiovascular:     Rate and Rhythm: Normal rate and regular rhythm.     Pulses: Normal pulses.     Heart sounds: Normal heart sounds.  Pulmonary:     Effort: Pulmonary effort is normal.     Breath sounds: Normal breath sounds.  Abdominal:     General: Bowel sounds are normal.  Musculoskeletal:        General: Normal range of motion.     Cervical back: Normal range of motion.  Skin:    Findings: Lesion present.  Neurological:     Mental Status: He is alert and oriented to person, place, and time.  Psychiatric:        Behavior: Behavior normal. Behavior is cooperative.    The plan was reviewed with the patient/family, and all questions/concerned were addressed.  It was my pleasure to see Albert today and participate in his care. Please feel free to contact me with any questions or concerns.  Sincerely,  Lynnell Raudel NP [facility]

## 2020-05-31 NOTE — Patient Instructions (Signed)

## 2020-05-31 NOTE — Assessment & Plan Note (Signed)
Annual physical exam completed provided education for preventative and health maintenance.  Printed handouts given to patient. Labs completed-CBC, CMP, lipid panel,

## 2020-06-01 LAB — LIPID PANEL
Chol/HDL Ratio: 8.1 ratio — ABNORMAL HIGH (ref 0.0–5.0)
Cholesterol, Total: 211 mg/dL — ABNORMAL HIGH (ref 100–199)
HDL: 26 mg/dL — ABNORMAL LOW (ref >39)
Triglycerides: 380 mg/dL — ABNORMAL HIGH (ref 0–149)
VLDL Cholesterol Cal: 67 mg/dL — ABNORMAL HIGH (ref 5–40)

## 2020-06-01 LAB — CBC WITH DIFFERENTIAL/PLATELET
Basophils Absolute: 0.1 10*3/uL (ref 0.0–0.2)
Eos: 2 %
Hematocrit: 48 % (ref 37.5–51.0)
Hemoglobin: 16.5 g/dL (ref 13.0–17.7)
Immature Grans (Abs): 0.1 10*3/uL (ref 0.0–0.1)
Immature Granulocytes: 1 %
Lymphocytes Absolute: 2.2 10*3/uL (ref 0.7–3.1)
Lymphs: 31 %
MCH: 31 pg (ref 26.6–33.0)
Monocytes: 8 %
Neutrophils: 56 %
Platelets: 170 10*3/uL (ref 150–450)
WBC: 7 10*3/uL (ref 3.4–10.8)

## 2020-06-01 LAB — COMPREHENSIVE METABOLIC PANEL
Albumin/Globulin Ratio: 1.7 (ref 1.2–2.2)
Albumin: 4.5 g/dL (ref 4.0–5.0)
Alkaline Phosphatase: 74 IU/L (ref 44–121)
BUN/Creatinine Ratio: 12 (ref 9–20)
BUN: 11 mg/dL (ref 6–20)
CO2: 26 mmol/L (ref 20–29)
Calcium: 9.5 mg/dL (ref 8.7–10.2)
Chloride: 103 mmol/L (ref 96–106)
Creatinine, Ser: 0.89 mg/dL (ref 0.76–1.27)
GFR calc non Af Amer: 108 mL/min/{1.73_m2} (ref >59)
Glucose: 79 mg/dL (ref 65–99)
Potassium: 3.9 mmol/L (ref 3.5–5.2)
Sodium: 142 mmol/L (ref 134–144)

## 2020-10-24 ENCOUNTER — Ambulatory Visit: Payer: BLUE CROSS/BLUE SHIELD | Admitting: Physician Assistant

## 2021-07-02 ENCOUNTER — Ambulatory Visit: Payer: 59 | Admitting: Nurse Practitioner

## 2021-07-02 ENCOUNTER — Encounter: Payer: Self-pay | Admitting: Nurse Practitioner

## 2021-07-02 VITALS — BP 122/82 | HR 78 | Temp 98.8°F | Ht 72.0 in | Wt 236.0 lb

## 2021-07-02 DIAGNOSIS — M549 Dorsalgia, unspecified: Secondary | ICD-10-CM

## 2021-07-02 MED ORDER — IBUPROFEN 600 MG PO TABS: 600.0000 mg | ORAL_TABLET | Freq: Three times a day (TID) | ORAL | 0 refills | Status: DC | PRN

## 2021-07-02 MED ORDER — METHOCARBAMOL 500 MG PO TABS: 500.0000 mg | ORAL_TABLET | Freq: Four times a day (QID) | ORAL | 0 refills | Status: DC

## 2021-07-02 NOTE — Progress Notes (Signed)
? ? ?Acute Office Visit ? ?Subjective:  ? ? Patient ID: Albert Jones, male    DOB: May 03, 1980, 41 y.o.   MRN: [id number] ? ?Chief Complaint  ?Patient presents with  ? upper back pain  ? ? ?Back Pain ?This is a new problem. The current episode started 1 to 4 weeks ago. The problem occurs intermittently. The problem is unchanged. The quality of the pain is described as aching. The pain is at a severity of 8/10. The pain is moderate. The pain is The same all the time. The symptoms are aggravated by bending, position and coughing. Stiffness is present All day. Pertinent negatives include no chest pain, headaches, numbness or paresthesias. He has tried nothing for the symptoms.  ? ? ?History reviewed. No pertinent past medical history. ? ?Past Surgical History:  ?Procedure Laterality Date  ? HERNIA REPAIR    ? LAPAROSCOPIC APPENDECTOMY N/A 07/12/2016  ? Procedure: APPENDECTOMY LAPAROSCOPIC, LAPAROSCOPIC LYSIS OF ADHESIONS, OMENTOPEXY OF STUMP, INCARCERATED SUPRAUMBILICAL HERNIA REPAIR;  Surgeon: Karie Soda, MD;  Location: [facility];  Service: General;  Laterality: N/A;  ? ? ?Family History  ?Problem Relation Age of Onset  ? Multiple sclerosis Mother   ? Heart disease Brother   ? ? ?Social History  ? ?Socioeconomic History  ? Marital status: Married  ?  Spouse name: Lelon Mast  ? Number of children: Not on file  ? Years of education: Not on file  ? Highest education level: High school graduate  ?Occupational History  ? Occupation: self employed  ?Tobacco Use  ? Smoking status: Former  ? Smokeless tobacco: Former  ?Vaping Use  ? Vaping Use: Never used  ?Substance and Sexual Activity  ? Alcohol use: Yes  ?  Alcohol/week: 2.0 standard drinks  ?  Types: 2 Shots of liquor per week  ?  Comment: occassionally  ? Drug use: Not Currently  ?  Frequency: 3.0 times per week  ?  Types: Marijuana  ? Sexual activity: Yes  ?  Birth control/protection: None  ?Other Topics Concern  ? Not on file  ?Social History Narrative  ? Not on file   ? ?Social Determinants of Health  ? ?Financial Resource Strain: Not on file  ?Food Insecurity: Not on file  ?Transportation Needs: Not on file  ?Physical Activity: Not on file  ?Stress: Not on file  ?Social Connections: Not on file  ?Intimate Partner Violence: Not on file  ? ? ?No outpatient medications prior to visit.  ? ?No facility-administered medications prior to visit.  ? ? ?No Known Allergies ? ?Review of Systems  ?Constitutional: Negative.   ?HENT: Negative.    ?Eyes: Negative.   ?Respiratory: Negative.    ?Cardiovascular:  Negative for chest pain.  ?Musculoskeletal:  Positive for back pain.  ?Neurological:  Negative for numbness, headaches and paresthesias.  ?All other systems reviewed and are negative. ? ?   ?Objective:  ?  ?Physical Exam ?Vitals and nursing note reviewed. Exam conducted with a chaperone present (spouse).  ?Constitutional:   ?   Appearance: He is obese.  ?HENT:  ?   Head: Normocephalic.  ?   Right Ear: External ear normal.  ?   Left Ear: External ear normal.  ?   Nose: Nose normal.  ?   Mouth/Throat:  ?   Mouth: Mucous membranes are moist.  ?   Pharynx: Oropharynx is clear.  ?Eyes:  ?   Conjunctiva/sclera: Conjunctivae normal.  ?Cardiovascular:  ?   Pulses: Normal pulses.  ?  Heart sounds: Normal heart sounds.  ?Pulmonary:  ?   Effort: Pulmonary effort is normal.  ?   Breath sounds: Normal breath sounds.  ?Abdominal:  ?   General: Bowel sounds are normal.  ?Musculoskeletal:  ?   Cervical back: Tenderness present.  ?     Back: ? ?   Comments: Upper back pain ?  ?Skin: ?   General: Skin is warm.  ?   Findings: No rash.  ?Neurological:  ?   Mental Status: He is alert and oriented to person, place, and time.  ? ? ?BP 122/82   Pulse 78   Temp 98.8 ?F (37.1 ?C)   Ht 6' (1.829 m)   Wt 236 lb (107 kg)   SpO2 96%   BMI 32.01 kg/m?  ?Wt Readings from Last 3 Encounters:  ?07/02/21 236 lb (107 kg)  ?05/31/20 230 lb 12.8 oz (104.7 kg)  ?07/12/16 210 lb (95.3 kg)  ? ? ?Health Maintenance Due   ?Topic Date Due  ? COVID-19 Vaccine (1) Never done  ? Hepatitis C Screening  Never done  ? TETANUS/TDAP  06/24/2020  ? INFLUENZA VACCINE  11/25/2020  ? ? ?There are no preventive care reminders to display for this patient. ? ? ?No results found for: TSH ?Lab Results  ?Component Value Date  ? WBC 7.0 05/31/2020  ? HGB 16.5 05/31/2020  ? HCT 48.0 05/31/2020  ? MCV 90 05/31/2020  ? PLT 170 05/31/2020  ? ?Lab Results  ?Component Value Date  ? NA 142 05/31/2020  ? K 3.9 05/31/2020  ? CO2 26 05/31/2020  ? GLUCOSE 79 05/31/2020  ? BUN 11 05/31/2020  ? CREATININE 0.89 05/31/2020  ? BILITOT 0.9 05/31/2020  ? ALKPHOS 74 05/31/2020  ? AST 20 05/31/2020  ? ALT 26 05/31/2020  ? PROT 7.2 05/31/2020  ? ALBUMIN 4.5 05/31/2020  ? CALCIUM 9.5 05/31/2020  ? ANIONGAP 8 07/15/2016  ? ?Lab Results  ?Component Value Date  ? CHOL 211 (H) 05/31/2020  ? ?Lab Results  ?Component Value Date  ? HDL 26 (L) 05/31/2020  ? ?Lab Results  ?Component Value Date  ? LDLCALC 118 (H) 05/31/2020  ? ?Lab Results  ?Component Value Date  ? TRIG 380 (H) 05/31/2020  ? ?Lab Results  ?Component Value Date  ? CHOLHDL 8.1 (H) 05/31/2020  ? ?No results found for: HGBA1C ? ?   ?Assessment & Plan:  ?Upper back pain not well controlled, symptoms in the past 2 weeks.  Patient moving PNO and pulled a muscle.  She rates pain 8 out of 10.  Continue warm compress, ibuprofen for pain as needed, Robaxin 500 mg tablet by mouth as needed for muscle spasm.  Follow-up with unresolved symptoms.  Education provided to patient Rx sent to pharmacy. ?Problem List Items Addressed This Visit   ?None ?Visit Diagnoses   ? ? Acute upper back pain    -  Primary  ? Relevant Medications  ? methocarbamol (ROBAXIN) 500 MG tablet  ? ibuprofen (ADVIL) 600 MG tablet  ? ?  ? ? ? ?Meds ordered this encounter  ?Medications  ? methocarbamol (ROBAXIN) 500 MG tablet  ?  Sig: Take 1 tablet (500 mg total) by mouth 4 (four) times daily.  ?  Dispense:  30 tablet  ?  Refill:  0  ?  Order Specific Question:    Supervising Provider  ?  AnswerMechele Claude [[id number]]  ? ibuprofen (ADVIL) 600 MG tablet  ?  Sig: Take 1  tablet (600 mg total) by mouth every 8 (eight) hours as needed.  ?  Dispense:  30 tablet  ?  Refill:  0  ?  Order Specific Question:   Supervising Provider  ?  AnswerMechele Claude:   STACKS, WARREN [[id number]]  ? ? ? ?Daryll Drownnyeje M Zafiro Routson, NP ? ?

## 2021-07-02 NOTE — Patient Instructions (Signed)

## 2022-02-05 ENCOUNTER — Ambulatory Visit (INDEPENDENT_AMBULATORY_CARE_PROVIDER_SITE_OTHER): Payer: 59 | Admitting: Family Medicine

## 2022-02-05 ENCOUNTER — Encounter: Payer: Self-pay | Admitting: Family Medicine

## 2022-02-05 VITALS — BP 135/85 | HR 88 | Temp 97.0°F | Ht 72.0 in | Wt 235.5 lb

## 2022-02-05 DIAGNOSIS — L918 Other hypertrophic disorders of the skin: Secondary | ICD-10-CM | POA: Diagnosis not present

## 2022-02-05 NOTE — Progress Notes (Signed)
   Acute Office Visit  Subjective:     Patient ID: Albert Jones, male    DOB: [birth year], 41 y.o.   MRN: [id number]  Chief Complaint  Patient presents with   Skin Tag    HPI Patient is in today for skin tags. He has multiple skin tags in each axilla. 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 Because of this, the skin tags are easily irritated and become inflamed. No significant changes in color or size. No exudate, warmth, or tenderness. Denies hx of skin cancer.   ROS As per HPI.      Objective:    BP 135/85   Pulse 88   Temp (!) 97 F (36.1 C) (Temporal)   Ht 6' (1.829 m)   Wt 235 lb 8 oz (106.8 kg)   SpO2 96%   BMI 31.94 kg/m    Physical Exam Vitals and nursing note reviewed.  Constitutional:      General: He is not in acute distress.    Appearance: He is not ill-appearing, toxic-appearing or diaphoretic.  Pulmonary:     Effort: Pulmonary effort is normal. No respiratory distress.  Skin:    General: Skin is warm and dry.     Comments: Skin tags present in bilateral axilla. No exudate or signs of infection.   Neurological:     General: No focal deficit present.     Mental Status: He is alert and oriented to person, place, and time.  Psychiatric:        Mood and Affect: Mood normal.        Behavior: Behavior normal.    Procedures PROCEDURE NOTE: Skin tag removal Patient given informed consent,verbal consent obtained.  Appropriate time out taken. Areas of concern cleansed with alcohol swabs.  Topical anesthesia spray applied. Once anaesthesia obtained, skin tags were removed using sterile scissors.  4 skin tags were removed in total.  The patient tolerated the procedure well.  Minimal bleeding. Simple dressings applied. Patient given post procedure instructions.       Assessment & Plan:   Albert was seen today for skin tag.  Diagnoses and all orders for this visit:  Inflamed skin tag Skin tag removal x 4 today. Discussed home care and return  precautions.   The patient indicates understanding of these issues and agrees with the plan.   Gwenlyn Perking, FNP

## 2022-02-05 NOTE — Patient Instructions (Addendum)
Home skin care after skin tag removal. - Clean the area with soap and water two times a day - Don't use hydrogen peroxide or alcohol, which can slow healing. - You may cover the wound with a thin layer of petroleum jelly, such as Vaseline, and a non-stick bandage.  If you develop increased pain, warmth at removal site, fevers, chills, redness, please seek immediate medical attention.  Otherwise, expect that areas treated will heal within the next couple of days. 

## 2022-07-30 ENCOUNTER — Encounter: Payer: 59 | Admitting: Family Medicine

## 2022-11-06 ENCOUNTER — Ambulatory Visit (HOSPITAL_COMMUNITY)
Admission: RE | Admit: 2022-11-06 | Discharge: 2022-11-06 | Disposition: A | Discharge status: Home / Self Care | Payer: 59 | Source: Ambulatory Visit | Attending: Family Medicine | Admitting: Family Medicine

## 2022-11-06 ENCOUNTER — Ambulatory Visit: Payer: 59 | Admitting: Family Medicine

## 2022-11-06 ENCOUNTER — Encounter: Payer: Self-pay | Admitting: Family Medicine

## 2022-11-06 VITALS — BP 139/90 | HR 68 | Temp 98.2°F | Ht 72.0 in | Wt 230.0 lb

## 2022-11-06 DIAGNOSIS — N5089 Other specified disorders of the male genital organs: Secondary | ICD-10-CM

## 2022-11-06 DIAGNOSIS — N50811 Right testicular pain: Secondary | ICD-10-CM | POA: Insufficient documentation

## 2022-11-06 DIAGNOSIS — R200 Anesthesia of skin: Secondary | ICD-10-CM

## 2022-11-06 DIAGNOSIS — R030 Elevated blood-pressure reading, without diagnosis of hypertension: Secondary | ICD-10-CM

## 2022-11-06 DIAGNOSIS — I861 Scrotal varices: Secondary | ICD-10-CM | POA: Diagnosis not present

## 2022-11-06 DIAGNOSIS — R202 Paresthesia of skin: Secondary | ICD-10-CM | POA: Diagnosis not present

## 2022-11-06 DIAGNOSIS — Z6831 Body mass index (BMI) 31.0-31.9, adult: Secondary | ICD-10-CM

## 2022-11-06 DIAGNOSIS — N433 Hydrocele, unspecified: Secondary | ICD-10-CM | POA: Diagnosis not present

## 2022-11-06 LAB — ANEMIA PROFILE B
EOS (ABSOLUTE): 0.2 10*3/uL (ref 0.0–0.4)
Eos: 3 %
Hematocrit: 47.7 % (ref 37.5–51.0)
Hemoglobin: 16.4 g/dL (ref 13.0–17.7)
Immature Grans (Abs): 0.1 10*3/uL (ref 0.0–0.1)
Lymphs: 31 %
MCH: 30.2 pg (ref 26.6–33.0)
MCHC: 34.4 g/dL (ref 31.5–35.7)
MCV: 88 fL (ref 79–97)
Monocytes Absolute: 0.4 10*3/uL (ref 0.1–0.9)
Monocytes: 7 %
Platelets: 159 10*3/uL (ref 150–450)
WBC: 5.9 10*3/uL (ref 3.4–10.8)

## 2022-11-06 LAB — CMP14+EGFR
ALT: 28 IU/L (ref 0–44)
AST: 20 IU/L (ref 0–40)
Alkaline Phosphatase: 78 IU/L (ref 44–121)
Bilirubin Total: 0.7 mg/dL (ref 0.0–1.2)
Creatinine, Ser: 0.95 mg/dL (ref 0.76–1.27)
Glucose: 103 mg/dL — ABNORMAL HIGH (ref 70–99)
Sodium: 141 mmol/L (ref 134–144)

## 2022-11-06 LAB — THYROID PANEL WITH TSH

## 2022-11-06 LAB — BAYER DCA HB A1C WAIVED: HB A1C (BAYER DCA - WAIVED): 5.5 % (ref 4.8–5.6)

## 2022-11-06 NOTE — Progress Notes (Signed)
Subjective:  Patient ID: Albert Jones, male    DOB: 23-Oct-1980, 42 y.o.   MRN: [id number]  Patient Care Team: Daryll Drown, NP (Inactive) as PCP - General (Nurse Practitioner)   Chief Complaint:  Lump on testicle right    HPI: Albert Hinger is a 42 y.o. male presenting on 11/06/2022 for Lump on testicle right    Pt presents today with his wife for evaluation of a lump found on his right testicle. Pt states he has not felt the lump, pts wife states it was very prominent last night. He has had some intermittent pain in his testicle. He denies weight loss, fever, chills, weakness, confusion, or night sweats. Denies any hematuria or hematospermia. He does report a numbness and tingling to his right lateral thigh. States this has been present for several months. Not new or worsening, no recent injuries. No loss of bowel or bladder, no saddle anesthesia.     Relevant past medical, surgical, family, and social history reviewed and updated as indicated.  Allergies and medications reviewed and updated. Data reviewed: Chart in Epic.   History reviewed. No pertinent past medical history.  Past Surgical History:  Procedure Laterality Date   HERNIA REPAIR     LAPAROSCOPIC APPENDECTOMY N/A 07/12/2016   Procedure: APPENDECTOMY LAPAROSCOPIC, LAPAROSCOPIC LYSIS OF ADHESIONS, OMENTOPEXY OF STUMP, INCARCERATED SUPRAUMBILICAL HERNIA REPAIR;  Surgeon: Karie Soda, MD;  Location: [facility];  Service: General;  Laterality: N/A;    Social History   Socioeconomic History   Marital status: Married    Spouse name: Samantha   Number of children: Not on file   Years of education: Not on file   Highest education level: High school graduate  Occupational History   Occupation: self employed  Tobacco Use   Smoking status: Former   Smokeless tobacco: Former  Building services engineer status: Never Used  Substance and Sexual Activity   Alcohol use: Yes    Alcohol/week: 2.0 standard drinks of alcohol     Types: 2 Shots of liquor per week    Comment: occassionally   Drug use: Not Currently    Frequency: 3.0 times per week    Types: Marijuana   Sexual activity: Yes    Birth control/protection: None  Other Topics Concern   Not on file  Social History Narrative   Not on file   Social Determinants of Health   Financial Resource Strain: Not on file  Food Insecurity: Not on file  Transportation Needs: Not on file  Physical Activity: Not on file  Stress: Not on file  Social Connections: Unknown (09/08/2021)   Received from [facility], [facility]   Social Network    Social Network: Not on file  Intimate Partner Violence: Unknown (07/31/2021)   Received from [facility], [facility]   HITS    Physically Hurt: Not on file    Insult or Talk Down To: Not on file    Threaten Physical Harm: Not on file    Scream or Curse: Not on file    No outpatient encounter medications on file as of 11/06/2022.   No facility-administered encounter medications on file as of 11/06/2022.    No Known Allergies  Review of Systems  Constitutional:  Negative for activity change, appetite change, chills, diaphoresis, fatigue, fever and unexpected weight change.  HENT: Negative.    Eyes: Negative.  Negative for photophobia and visual disturbance.  Respiratory:  Negative for cough, chest tightness and shortness of breath.  Cardiovascular:  Negative for chest pain, palpitations and leg swelling.  Gastrointestinal:  Negative for abdominal pain, blood in stool, constipation, diarrhea, nausea and vomiting.  Endocrine: Negative.   Genitourinary:  Positive for testicular pain. Negative for decreased urine volume, difficulty urinating, dysuria, enuresis, flank pain, frequency, genital sores, hematuria, penile discharge, penile pain, penile swelling, scrotal swelling and urgency.  Musculoskeletal:  Negative for arthralgias and myalgias.  Skin: Negative.   Allergic/Immunologic: Negative.   Neurological:   Positive for numbness (right lateral thigh). Negative for dizziness, tremors, seizures, syncope, facial asymmetry, speech difficulty, weakness, light-headedness and headaches.  Hematological: Negative.   Psychiatric/Behavioral:  Negative for confusion, hallucinations, sleep disturbance and suicidal ideas.   All other systems reviewed and are negative.       Objective:  BP (!) 139/90   Pulse 68   Temp 98.2 F (36.8 C) (Temporal)   Ht 6' (1.829 m)   Wt 230 lb (104.3 kg)   SpO2 96%   BMI 31.19 kg/m    Wt Readings from Last 3 Encounters:  11/06/22 230 lb (104.3 kg)  02/05/22 235 lb 8 oz (106.8 kg)  07/02/21 236 lb (107 kg)    Physical Exam Vitals and nursing note reviewed. Exam conducted with a chaperone present.  Constitutional:      General: He is not in acute distress.    Appearance: Normal appearance. He is well-developed and well-groomed. He is obese. He is not ill-appearing, toxic-appearing or diaphoretic.  HENT:     Head: Normocephalic and atraumatic.     Jaw: There is normal jaw occlusion.     Right Ear: Hearing normal.     Left Ear: Hearing normal.     Nose: Nose normal.     Mouth/Throat:     Lips: Pink.     Mouth: Mucous membranes are moist.     Pharynx: Oropharynx is clear. Uvula midline.  Eyes:     General: Lids are normal.     Conjunctiva/sclera: Conjunctivae normal.     Pupils: Pupils are equal, round, and reactive to light.  Neck:     Thyroid: No thyroid mass, thyromegaly or thyroid tenderness.     Vascular: No carotid bruit or JVD.     Trachea: Trachea and phonation normal.  Cardiovascular:     Rate and Rhythm: Normal rate and regular rhythm.     Chest Wall: PMI is not displaced.     Pulses: Normal pulses.     Heart sounds: Normal heart sounds. No murmur heard.    No friction rub. No gallop.  Pulmonary:     Effort: Pulmonary effort is normal. No respiratory distress.     Breath sounds: Normal breath sounds. No wheezing.  Abdominal:     General:  Bowel sounds are normal. There is no abdominal bruit.     Palpations: Abdomen is soft. There is no hepatomegaly or splenomegaly.  Genitourinary:    Pubic Area: No rash or pubic lice.      Penis: Normal and circumcised. No phimosis, paraphimosis, hypospadias, erythema, tenderness, discharge, swelling or lesions.      Testes: Cremasteric reflex is present.        Right: Mass present. Tenderness, swelling, testicular hydrocele or varicocele not present. Right testis is descended. Cremasteric reflex is present.         Left: Mass, tenderness, swelling, testicular hydrocele or varicocele not present. Left testis is descended. Cremasteric reflex is present.      Tanner stage (genital): 5.    Musculoskeletal:  General: Normal range of motion.     Cervical back: Normal range of motion and neck supple.     Right lower leg: No edema.     Left lower leg: No edema.  Lymphadenopathy:     Cervical: No cervical adenopathy.  Skin:    General: Skin is warm and dry.     Capillary Refill: Capillary refill takes less than 2 seconds.     Coloration: Skin is not cyanotic, jaundiced or pale.     Findings: No rash.  Neurological:     General: No focal deficit present.     Mental Status: He is alert and oriented to person, place, and time.     Sensory: Sensation is intact.     Motor: Motor function is intact.     Coordination: Coordination is intact.     Gait: Gait is intact.     Deep Tendon Reflexes: Reflexes are normal and symmetric.  Psychiatric:        Attention and Perception: Attention and perception normal.        Mood and Affect: Mood and affect normal.        Speech: Speech normal.        Behavior: Behavior normal. Behavior is cooperative.        Thought Content: Thought content normal.        Cognition and Memory: Cognition and memory normal.        Judgment: Judgment normal.     Results for orders placed or performed in visit on 05/31/20  CBC with Differential  Result Value Ref  Range   WBC 7.0 3.4 - 10.8 x10E3/uL   RBC 5.33 4.14 - 5.80 x10E6/uL   Hemoglobin 16.5 13.0 - 17.7 g/dL   Hematocrit 78.2 95.6 - 51.0 %   MCV 90 79 - 97 fL   MCH 31.0 26.6 - 33.0 pg   MCHC 34.4 31.5 - 35.7 g/dL   RDW 21.3 08.6 - 57.8 %   Platelets 170 150 - 450 x10E3/uL   Neutrophils 56 Not Estab. %   Lymphs 31 Not Estab. %   Monocytes 8 Not Estab. %   Eos 2 Not Estab. %   Basos 2 Not Estab. %   Neutrophils Absolute 4.0 1.4 - 7.0 x10E3/uL   Lymphocytes Absolute 2.2 0.7 - 3.1 x10E3/uL   Monocytes Absolute 0.5 0.1 - 0.9 x10E3/uL   EOS (ABSOLUTE) 0.1 0.0 - 0.4 x10E3/uL   Basophils Absolute 0.1 0.0 - 0.2 x10E3/uL   Immature Granulocytes 1 Not Estab. %   Immature Grans (Abs) 0.1 0.0 - 0.1 x10E3/uL  Comprehensive metabolic panel  Result Value Ref Range   Glucose 79 65 - 99 mg/dL   BUN 11 6 - 20 mg/dL   Creatinine, Ser 4.69 0.76 - 1.27 mg/dL   GFR calc non Af Amer 108 >59 mL/min/1.73   GFR calc Af Amer 125 >59 mL/min/1.73   BUN/Creatinine Ratio 12 9 - 20   Sodium 142 134 - 144 mmol/L   Potassium 3.9 3.5 - 5.2 mmol/L   Chloride 103 96 - 106 mmol/L   CO2 26 20 - 29 mmol/L   Calcium 9.5 8.7 - 10.2 mg/dL   Total Protein 7.2 6.0 - 8.5 g/dL   Albumin 4.5 4.0 - 5.0 g/dL   Globulin, Total 2.7 1.5 - 4.5 g/dL   Albumin/Globulin Ratio 1.7 1.2 - 2.2   Bilirubin Total 0.9 0.0 - 1.2 mg/dL   Alkaline Phosphatase 74 44 - 121 IU/L   AST  20 0 - 40 IU/L   ALT 26 0 - 44 IU/L  Lipid Panel  Result Value Ref Range   Cholesterol, Total 211 (H) 100 - 199 mg/dL   Triglycerides 161 (H) 0 - 149 mg/dL   HDL 26 (L) >09 mg/dL   VLDL Cholesterol Cal 67 (H) 5 - 40 mg/dL   LDL Chol Calc (NIH) 604 (H) 0 - 99 mg/dL   Chol/HDL Ratio 8.1 (H) 0.0 - 5.0 ratio       Pertinent labs & imaging results that were available during my care of the patient were reviewed by me and considered in my medical decision making.  Assessment & Plan:  Albert was seen today for lump on testicle right .  Diagnoses and all orders  for this visit:  Testicle lump Pain in right testicle US order for further evaluation.  -     US SCROTUM W/DOPPLER; Future  Numbness and tingling of right leg Will check below for potential underlying causes. Report new, worsening, or persistent symptoms.  -     Anemia Profile B -     CMP14+EGFR -     Thyroid Panel With TSH -     Bayer DCA Hb A1c Waived -     VITAMIN D 25 Hydroxy (Vit-D Deficiency, Fractures)  BMI 31.0-31.9,adult Diet and exercise encouraged. Labs pending.  -     Anemia Profile B -     CMP14+EGFR -     Thyroid Panel With TSH -     Bayer DCA Hb A1c Waived -     VITAMIN D 25 Hydroxy (Vit-D Deficiency, Fractures)  Elevated blood pressure reading without diagnosis of hypertension DASH diet and exercise encouraged. BP log provided to pt. Pt to bring back to office at follow up. Will determine if medications need to be started.     Continue all other maintenance medications.  Follow up plan: Return in about 4 weeks (around 12/04/2022), or if symptoms worsen or fail to improve, for needs to establish care, HTN.   Continue healthy lifestyle choices, including diet (rich in fruits, vegetables, and lean proteins, and low in salt and simple carbohydrates) and exercise (at least 30 minutes of moderate physical activity daily).  Educational handout given for DASH diet, HTN  The above assessment and management plan was discussed with the patient. The patient verbalized understanding of and has agreed to the management plan. Patient is aware to call the clinic if they develop any new symptoms or if symptoms persist or worsen. Patient is aware when to return to the clinic for a follow-up visit. Patient educated on when it is appropriate to go to the emergency department.   Kari Baars, FNP-C [facility] [phone]

## 2022-11-06 NOTE — Patient Instructions (Signed)

## 2022-11-06 NOTE — Addendum Note (Signed)
Addended by: Sonny Masters on: 11/06/2022 11:59 AM   Modules accepted: Orders

## 2022-11-07 LAB — ANEMIA PROFILE B
Basophils Absolute: 0.1 10*3/uL (ref 0.0–0.2)
Basos: 2 %
Immature Granulocytes: 1 %
Iron Saturation: 31 % (ref 15–55)
Lymphocytes Absolute: 1.8 10*3/uL (ref 0.7–3.1)
Neutrophils Absolute: 3.3 10*3/uL (ref 1.4–7.0)
Neutrophils: 56 %
RBC: 5.43 x10E6/uL (ref 4.14–5.80)
RDW: 13.1 % (ref 11.6–15.4)
Retic Ct Pct: 1.6 % (ref 0.6–2.6)
UIBC: 214 ug/dL (ref 111–343)

## 2022-11-07 LAB — CMP14+EGFR
Albumin: 4.5 g/dL (ref 4.1–5.1)
BUN/Creatinine Ratio: 12 (ref 9–20)
BUN: 11 mg/dL (ref 6–24)
CO2: 23 mmol/L (ref 20–29)
Calcium: 9.5 mg/dL (ref 8.7–10.2)
Chloride: 104 mmol/L (ref 96–106)
Globulin, Total: 2.4 g/dL (ref 1.5–4.5)
Potassium: 4.7 mmol/L (ref 3.5–5.2)
Total Protein: 6.9 g/dL (ref 6.0–8.5)
eGFR: 102 mL/min/{1.73_m2} (ref >59)

## 2022-11-07 LAB — VITAMIN D 25 HYDROXY (VIT D DEFICIENCY, FRACTURES): Vit D, 25-Hydroxy: 36.9 ng/mL (ref 30.0–100.0)

## 2022-11-07 LAB — THYROID PANEL WITH TSH: T3 Uptake Ratio: 25 % (ref 24–39)

## 2022-12-04 ENCOUNTER — Other Ambulatory Visit: Payer: 59

## 2022-12-16 ENCOUNTER — Ambulatory Visit: Payer: 59 | Admitting: Family Medicine

## 2023-03-16 ENCOUNTER — Encounter: Payer: Self-pay | Admitting: Family Medicine

## 2023-03-16 ENCOUNTER — Ambulatory Visit: Payer: 59 | Admitting: Family Medicine

## 2023-03-16 VITALS — BP 121/84 | HR 87 | Temp 97.9°F | Ht 73.0 in | Wt 234.4 lb

## 2023-03-16 DIAGNOSIS — L918 Other hypertrophic disorders of the skin: Secondary | ICD-10-CM | POA: Diagnosis not present

## 2023-03-16 DIAGNOSIS — L821 Other seborrheic keratosis: Secondary | ICD-10-CM | POA: Diagnosis not present

## 2023-03-16 NOTE — Progress Notes (Signed)
Subjective:  Patient ID: Albert Jones, male    DOB: 1980/11/28, 42 y.o.   MRN: [id number]  Patient Care Team: Daryll Drown, NP (Inactive) as PCP - General (Nurse Practitioner)   Chief Complaint:  skin tags   HPI: Albert Jones is a 42 y.o. male presenting on 03/16/2023 for skin tags   Discussed the use of AI scribe software for clinical note transcription with the patient, who gave verbal consent to proceed.  History of Present Illness   The patient presents with two separate skin concerns. Firstly, he has noticed multiple skin tags under his arms, which he prefers to have removed before they grow larger. He has had skin tags removed in the past. Secondly, he has a lesion on his back that feels like a scab. This lesion has been present for a short while, and the patient has a history of sunburns. He has not scratched or irritated the lesion, but it has not healed. The patient has had moles removed in the past, both in childhood and more recently, but he cannot recall if these procedures were performed by a dermatologist or a primary care provider. He is unsure of the exact number of skin tags he has, but they are present under both arms. The patient has attempted self-removal of some skin tags with varying success.        Relevant past medical, surgical, family, and social history reviewed and updated as indicated.  Allergies and medications reviewed and updated. Data reviewed: Chart in Epic.   History reviewed. No pertinent past medical history.  Past Surgical History:  Procedure Laterality Date   HERNIA REPAIR     LAPAROSCOPIC APPENDECTOMY N/A 07/12/2016   Procedure: APPENDECTOMY LAPAROSCOPIC, LAPAROSCOPIC LYSIS OF ADHESIONS, OMENTOPEXY OF STUMP, INCARCERATED SUPRAUMBILICAL HERNIA REPAIR;  Surgeon: Karie Soda, MD;  Location: [facility];  Service: General;  Laterality: N/A;    Social History   Socioeconomic History   Marital status: Married    Spouse name: Samantha    Number of children: Not on file   Years of education: Not on file   Highest education level: High school graduate  Occupational History   Occupation: self employed  Tobacco Use   Smoking status: Former   Smokeless tobacco: Former  Building services engineer status: Never Used  Substance and Sexual Activity   Alcohol use: Yes    Alcohol/week: 2.0 standard drinks of alcohol    Types: 2 Shots of liquor per week    Comment: occassionally   Drug use: Not Currently    Frequency: 3.0 times per week    Types: Marijuana   Sexual activity: Yes    Birth control/protection: None  Other Topics Concern   Not on file  Social History Narrative   Not on file   Social Determinants of Health   Financial Resource Strain: Not on file  Food Insecurity: Not on file  Transportation Needs: Not on file  Physical Activity: Not on file  Stress: Not on file  Social Connections: Unknown (09/08/2021)   [facility] from [facility]   Social Network    Social Network: Not on file  Intimate Partner Violence: Unknown (07/31/2021)   Received from [facility]   HITS    Physically Hurt: Not on file    Insult or Talk Down To: Not on file    Threaten Physical Harm: Not on file    Scream or Curse: Not on file    No  outpatient encounter medications on file as of 03/16/2023.   No facility-administered encounter medications on file as of 03/16/2023.    No Known Allergies  Pertinent ROS per HPI, otherwise unremarkable      Objective:  BP 121/84   Pulse 87   Temp 97.9 F (36.6 C) (Temporal)   Ht 6\' 1"  (1.854 m)   Wt 234 lb 6.4 oz (106.3 kg)   SpO2 96%   BMI 30.93 kg/m    Wt Readings from Last 3 Encounters:  03/16/23 234 lb 6.4 oz (106.3 kg)  11/06/22 230 lb (104.3 kg)  02/05/22 235 lb 8 oz (106.8 kg)    Physical Exam Vitals and nursing note reviewed.  Constitutional:      General: He is not in acute distress.    Appearance: Normal appearance. He is well-developed  and well-groomed. He is obese. He is not ill-appearing, toxic-appearing or diaphoretic.  HENT:     Head: Normocephalic and atraumatic.     Jaw: There is normal jaw occlusion.     Right Ear: Hearing normal.     Left Ear: Hearing normal.     Nose: Nose normal.     Mouth/Throat:     Lips: Pink.     Mouth: Mucous membranes are moist.     Pharynx: Oropharynx is clear. Uvula midline.  Eyes:     General: Lids are normal.     Extraocular Movements: Extraocular movements intact.     Conjunctiva/sclera: Conjunctivae normal.     Pupils: Pupils are equal, round, and reactive to light.  Neck:     Thyroid: No thyroid mass, thyromegaly or thyroid tenderness.     Vascular: No carotid bruit or JVD.     Trachea: Trachea and phonation normal.  Cardiovascular:     Rate and Rhythm: Normal rate and regular rhythm.     Chest Wall: PMI is not displaced.     Pulses: Normal pulses.     Heart sounds: Normal heart sounds. No murmur heard.    No friction rub. No gallop.  Pulmonary:     Effort: Pulmonary effort is normal. No respiratory distress.     Breath sounds: Normal breath sounds. No wheezing.  Abdominal:     General: Bowel sounds are normal. There is no distension or abdominal bruit.     Palpations: Abdomen is soft. There is no hepatomegaly or splenomegaly.     Tenderness: There is no abdominal tenderness. There is no right CVA tenderness or left CVA tenderness.     Hernia: No hernia is present.  Musculoskeletal:        General: Normal range of motion.     Cervical back: Normal range of motion and neck supple.     Right lower leg: No edema.     Left lower leg: No edema.  Lymphadenopathy:     Cervical: No cervical adenopathy.  Skin:    General: Skin is warm and dry.     Capillary Refill: Capillary refill takes less than 2 seconds.     Coloration: Skin is not cyanotic, jaundiced or pale.     Findings: Lesion present. No rash.          Comments: Bilateral axilla: multiple skin colored  pedunculated lesions with erythematous bases.   Neurological:     General: No focal deficit present.     Mental Status: He is alert and oriented to person, place, and time.     Sensory: Sensation is intact.     Motor: Motor  function is intact.     Coordination: Coordination is intact.     Gait: Gait is intact.     Deep Tendon Reflexes: Reflexes are normal and symmetric.  Psychiatric:        Attention and Perception: Attention and perception normal.        Mood and Affect: Mood and affect normal.        Speech: Speech normal.        Behavior: Behavior normal. Behavior is cooperative.        Thought Content: Thought content normal.        Cognition and Memory: Cognition and memory normal.        Judgment: Judgment normal.    SKIN: Lesion on left shoulder, Seborrheic keratosis on back, characterized as 'buildup of keratin', not discolored, borders regular. Several skin tags noted to bilateral axilla, one to right side, 2 to posterior neck.        Results for orders placed or performed in visit on 11/06/22  Anemia Profile B  Result Value Ref Range   Total Iron Binding Capacity 309 250 - 450 ug/dL   UIBC 213 086 - 578 ug/dL   Iron 95 38 - 469 ug/dL   Iron Saturation 31 15 - 55 %   Ferritin 162 30 - 400 ng/mL   Vitamin B-12 460 232 - 1,245 pg/mL   Folate 5.0 >3.0 ng/mL   WBC 5.9 3.4 - 10.8 x10E3/uL   RBC 5.43 4.14 - 5.80 x10E6/uL   Hemoglobin 16.4 13.0 - 17.7 g/dL   Hematocrit 62.9 52.8 - 51.0 %   MCV 88 79 - 97 fL   MCH 30.2 26.6 - 33.0 pg   MCHC 34.4 31.5 - 35.7 g/dL   RDW 41.3 24.4 - 01.0 %   Platelets 159 150 - 450 x10E3/uL   Neutrophils 56 Not Estab. %   Lymphs 31 Not Estab. %   Monocytes 7 Not Estab. %   Eos 3 Not Estab. %   Basos 2 Not Estab. %   Neutrophils Absolute 3.3 1.4 - 7.0 x10E3/uL   Lymphocytes Absolute 1.8 0.7 - 3.1 x10E3/uL   Monocytes Absolute 0.4 0.1 - 0.9 x10E3/uL   EOS (ABSOLUTE) 0.2 0.0 - 0.4 x10E3/uL   Basophils Absolute 0.1 0.0 - 0.2 x10E3/uL    Immature Granulocytes 1 Not Estab. %   Immature Grans (Abs) 0.1 0.0 - 0.1 x10E3/uL   Retic Ct Pct 1.6 0.6 - 2.6 %  CMP14+EGFR  Result Value Ref Range   Glucose 103 (H) 70 - 99 mg/dL   BUN 11 6 - 24 mg/dL   Creatinine, Ser 2.72 0.76 - 1.27 mg/dL   eGFR 536 >64 QI/HKV/4.25   BUN/Creatinine Ratio 12 9 - 20   Sodium 141 134 - 144 mmol/L   Potassium 4.7 3.5 - 5.2 mmol/L   Chloride 104 96 - 106 mmol/L   CO2 23 20 - 29 mmol/L   Calcium 9.5 8.7 - 10.2 mg/dL   Total Protein 6.9 6.0 - 8.5 g/dL   Albumin 4.5 4.1 - 5.1 g/dL   Globulin, Total 2.4 1.5 - 4.5 g/dL   Bilirubin Total 0.7 0.0 - 1.2 mg/dL   Alkaline Phosphatase 78 44 - 121 IU/L   AST 20 0 - 40 IU/L   ALT 28 0 - 44 IU/L  Thyroid Panel With TSH  Result Value Ref Range   TSH 1.360 0.450 - 4.500 uIU/mL   T4, Total 7.1 4.5 - 12.0 ug/dL   T3 Uptake  Ratio 25 24 - 39 %   Free Thyroxine Index 1.8 1.2 - 4.9  Bayer DCA Hb A1c Waived  Result Value Ref Range   HB A1C (BAYER DCA - WAIVED) 5.5 4.8 - 5.6 %  VITAMIN D 25 Hydroxy (Vit-D Deficiency, Fractures)  Result Value Ref Range   Vit D, 25-Hydroxy 36.9 30.0 - 100.0 ng/mL     Skin excision  Date/Time: 03/16/2023 12:30 PM  Performed by: Sonny Masters, FNP Authorized by: Sonny Masters, FNP   Number of Lesions: 1 Lesion 1:    Body area: trunk   Trunk location: back   Initial size (mm): 4   Malignancy: malignancy unknown     Destruction method: cryotherapy     Destruction method comment: three freeze thaw cycles Skin excision  Date/Time: 03/16/2023 12:30 PM  Performed by: Sonny Masters, FNP Authorized by: Sonny Masters, FNP   Number of Lesions: 6 Lesion 1:    Body area: trunk   Trunk location: R axilla   Initial size (mm): 2   Malignancy: malignancy unknown     Destruction method: scissors used for extraction   Lesion 2:    Body area: trunk   Trunk location: R axilla   Initial size (mm): 4   Malignancy: malignancy unknown     Destruction method: scissors used for  extraction   Lesion 3:    Body area: trunk   Trunk location: R axilla   Initial size (mm): 3   Malignancy: malignancy unknown     Destruction method: scissors used for extraction   Lesion 4:    Body area: trunk   Trunk location: R axilla   Initial size (mm): 3   Malignancy: malignancy unknown     Destruction method: scissors used for extraction   Lesion 5:    Body area: trunk   Trunk location: R axilla   Malignancy: malignancy unknown     Destruction method: scissors used for extraction   Lesion 6:    Body area: trunk   Trunk location: R axilla   Initial size (mm): 4   Malignancy: malignancy unknown     Destruction method: scissors used for extraction   Skin excision  Date/Time: 03/16/2023 7:41 PM  Performed by: Sonny Masters, FNP Authorized by: Sonny Masters, FNP   Number of Lesions: 6 Lesion 1:    Body area: trunk   Trunk location: R flank   Initial size (mm): 4   Malignancy: malignancy unknown     Destruction method: scissors used for extraction   Lesion 2:    Body area: trunk   Trunk location: L axilla   Initial size (mm): 4   Malignancy: malignancy unknown     Destruction method: scissors used for extraction   Lesion 3:    Body area: trunk   Trunk location: L axilla   Initial size (mm): 3   Malignancy: malignancy unknown     Destruction method: scissors used for extraction   Lesion 4:    Body area: trunk   Trunk location: L axilla   Initial size (mm): 2   Malignancy: malignancy unknown     Destruction method: scissors used for extraction   Lesion 5:    Body area: trunk   Trunk location: L axilla   Inital size (mm): 3   Malignancy: malignancy unknown     Destruction method: scissors used for extraction   Lesion 6:    Body area: trunk   Trunk location:  L axilla   Initial size (mm): 3   Malignancy: malignancy unknown     Destruction method: scissors used for extraction   Skin excision  Date/Time: 03/16/2023 7:43 PM  Performed by: Sonny Masters, FNP Authorized by: Sonny Masters, FNP   Number of Lesions: 2 Lesion 1:    Body area: head/neck   Head/neck location: neck   Initial size (mm): 3   Malignancy: malignancy unknown     Destruction method: scissors used for extraction   Lesion 2:    Body area: head/neck   Head/neck location: neck   Initial size (mm): 3   Malignancy: malignancy unknown     Destruction method: scissors used for extraction        Pertinent labs & imaging results that were available during my care of the patient were reviewed by me and considered in my medical decision making.  Assessment & Plan:  Albert was seen today for skin tags.  Diagnoses and all orders for this visit:  Seborrheic keratosis -     Skin excision  Inflamed skin tag -     Skin excision -     Skin excision -     Skin excision     Assessment and Plan    Seborrheic Keratosis   Seborrheic keratosis on the left shoulder. The lesion is scaly, non-discolored, and has regular borders, consistent with benign characteristics. Discussed the benign nature and simple freezing procedure.   - Cryotherapy for the seborrheic keratosis on the left shoulder   - Provide post-treatment wound care instructions   - Advise follow-up in 4-6 weeks if the lesion persists    Skin Tags   Multiple skin tags under the arms and on the back. Patient prefers removal before he becomes larger or bothersome. Discussed numbing spray and the freezing and cutting procedure.   - Cryotherapy and excision of all identified skin tags using numbing spray   - Provide post-treatment wound care instructions.       Greater than 50% of time was spent removing skin lesions and discussing aftercare.    Continue all other maintenance medications.  Follow up plan: Return if symptoms worsen or fail to improve.   Continue healthy lifestyle choices, including diet (rich in fruits, vegetables, and lean proteins, and low in salt and simple carbohydrates) and exercise  (at least 30 minutes of moderate physical activity daily).  Educational handout given for cryotherapy, mole, skin tag  The above assessment and management plan was discussed with the patient. The patient verbalized understanding of and has agreed to the management plan. Patient is aware to call the clinic if they develop any new symptoms or if symptoms persist or worsen. Patient is aware when to return to the clinic for a follow-up visit. Patient educated on when it is appropriate to go to the emergency department.   Kari Baars, FNP-C [facility] [phone]

## 2023-09-23 ENCOUNTER — Ambulatory Visit: Payer: Self-pay

## 2023-09-23 ENCOUNTER — Encounter: Payer: Self-pay | Admitting: Nurse Practitioner

## 2023-09-23 ENCOUNTER — Ambulatory Visit: Admitting: Nurse Practitioner

## 2023-09-23 VITALS — BP 144/88 | HR 78 | Temp 97.7°F | Ht 73.0 in | Wt 249.0 lb

## 2023-09-23 DIAGNOSIS — L247 Irritant contact dermatitis due to plants, except food: Secondary | ICD-10-CM | POA: Diagnosis not present

## 2023-09-23 DIAGNOSIS — B86 Scabies: Secondary | ICD-10-CM | POA: Diagnosis not present

## 2023-09-23 MED ORDER — PERMETHRIN 5 % EX CREA: 1.0000 | TOPICAL_CREAM | Freq: Once | CUTANEOUS | 0 refills | Status: AC

## 2023-09-23 MED ORDER — PREDNISONE 20 MG PO TABS: 40.0000 mg | ORAL_TABLET | Freq: Every day | ORAL | 0 refills | Status: AC

## 2023-09-23 NOTE — Progress Notes (Signed)
   Subjective:    Patient ID: Albert Jones, male    DOB: 1980/10/08, 43 y.o.   MRN: [id number]   Chief Complaint: Rash (On hands on and arms and on right back/)   Rash Pertinent negatives include no eye pain or shortness of breath.    Has rash on hands arms and back. Has been there for over a week. Is very itchy.  No exposure at allergens or topical things. 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  Patient Active Problem List   Diagnosis Date Noted   Skin lesion 05/31/2020   Acute gangrenous appendicitis s/p lap appendectomy & omentopexy 07/12/2016 07/12/2016   Incarcerated epigastric hernia s/p primary repair 07/12/2016 07/12/2016   Excessive sweating 05/02/2015       Review of Systems  Constitutional:  Negative for diaphoresis.  Eyes:  Negative for pain.  Respiratory:  Negative for shortness of breath.   Cardiovascular:  Negative for chest pain, palpitations and leg swelling.  Gastrointestinal:  Negative for abdominal pain.  Endocrine: Negative for polydipsia.  Skin:  Positive for rash.  Neurological:  Negative for dizziness, weakness and headaches.  Hematological:  Does not bruise/bleed easily.  All other systems reviewed and are negative.      Objective:   Physical Exam Constitutional:      Appearance: Normal appearance.  Cardiovascular:     Rate and Rhythm: Normal rate.     Heart sounds: Normal heart sounds.  Pulmonary:     Breath sounds: Normal breath sounds.  Skin:    Comments: Rash on hands is erythematous and linear in webb spaces of fingers Rask in linear patterns on forearms Rash on right flank is patchy maculo papular erythematous rash- looks different then rash on hands  Neurological:     General: No focal deficit present.     Mental Status: He is alert and oriented to person, place, and time.  Psychiatric:        Mood and Affect: Mood normal.        Behavior: Behavior normal.    BP (!) 144/88   Pulse 78   Temp 97.7  F (36.5 C) (Temporal)   Ht 6\' 1"  (1.854 m)   Wt 249 lb (112.9 kg)   SpO2 95%   BMI 32.85 kg/m         Assessment & Plan:  Albert Jones in today with chief complaint of Rash (On hands on and arms and on right back/)   1. Irritant contact dermatitis due to plants, except food (Primary) Avoid scratching Cool compresses - predniSONE (DELTASONE) 20 MG tablet; Take 2 tablets (40 mg total) by mouth daily with breakfast for 5 days. 2 po daily for 5 days  Dispense: 10 tablet; Refill: 0  2. Scabies infestation Leave cream on fro 8 hours then rinse Clean bed linens after treatment - permethrin (ELIMITE) 5 % cream; Apply 1 Application topically once for 1 dose.  Dispense: 60 g; Refill: 0    The above assessment and management plan was discussed with the patient. The patient verbalized understanding of and has agreed to the management plan. Patient is aware to call the clinic if symptoms persist or worsen. Patient is aware when to return to the clinic for a follow-up visit. Patient educated on when it is appropriate to go to the emergency department.   Mary-Margaret Gaylyn Keas, FNP

## 2023-09-23 NOTE — Patient Instructions (Signed)
Scabies, Adult  Scabies is a skin condition that happens when very small insects called mites get under your skin. This causes severe itchiness and a rash that looks like pimples. Scabies is contagious. This means it can spread easily from person to person. If you get scabies, the people you live with may get it too. With the right treatment, symptoms often go away in 2-4 weeks. In most cases, scabies does not cause lasting problems. What are the causes? Scabies is caused by tiny mites (Sarcoptes scabiei) that can only be seen with a microscope. The mites get into the top layer of your skin and lay eggs. This is called an infestation. You may get scabies if: You have close contact with someone who has scabies. You come in contact with items that have the mites on them. These may include towels, bedding, or clothes. What increases the risk? You may be more likely to get scabies if: You live in a nursing home or extended care facility. You spend time in a place where a lot of people live close together, such as a shelter or prison. You have sex with a partner who has scabies. You care for others who are at risk for scabies. What are the signs or symptoms? Symptoms of scabies include: A rash that looks like pimples. It may include tiny red bumps or blisters. It is often found in the skinfolds or on the hands, wrists, elbows, armpits, chest, waist, groin, or buttocks. Severe itchiness. This is often worse at night. Skin irritation. This can include scaly patches or sores. The bumps from scabies may form a line (burrow) on the skin. The line may look thin, crooked, and grayish-white or skin colored. How is this diagnosed? Scabies may be diagnosed based on a physical exam of your skin. You may also have a skin test done. A sample of your skin may be taken (skin scraping) and looked at under a microscope for signs of mites. How is this treated? Scabies may be treated with: Medicated creams or  lotions to kill the mites. The cream or lotion is spread on your whole body and left for a few hours. In most cases, one treatment is enough to kill all the mites. In severe cases, the treatment may need to be done more than once. Medicated cream to help with the itching. Medicines taken by mouth (orally). These may help: Relieve itching. Reduce the swelling and redness. Kill the mites. This treatment may be used in severe cases. Follow these instructions at home: Medicines Take or apply over-the-counter and prescription medicines only as told by your health care provider. Apply medicated cream or lotion as told by your provider. Do not wash off the medicated cream or lotion until enough time has passed or as told by your provider. Skin care Try not to scratch or pick at the affected areas of your skin. Keep your fingernails closely trimmed. This can help reduce injury from scratching. Take cool baths or apply cool, wet cloths to your skin. This can help reduce itching. General instructions Clean all items that you touched in the 3 days before you were diagnosed. This includes bedding, clothes, towels, and furniture. Do this on the same day that you start treatment. Dry-clean items or use hot water to wash them. Dry them on the hot dry cycle. Place items that cannot be washed into closed, airtight plastic bags for at least 3 days. The mites cannot live for more than 3 days away from  human skin. Vacuum your furniture and mattresses. Make sure that other people who may have been infested see a provider. Where to find more information Centers for Disease Control and Prevention (CDC): [employer].no Contact a health care provider if: You have itching that does not go away after 4 weeks of treatment. You keep getting new bumps or burrows. You have redness, swelling, or pain near your rash after treatment. You have fluid, blood, or pus coming from your rash. You get thick crusts or scaly patches over  large areas of your skin. You have a fever. This information is not intended to replace advice given to you by your health care provider. Make sure you discuss any questions you have with your health care provider. Document Revised: 01/19/2022 Document Reviewed: 01/19/2022 Elsevier Patient Education  2024 [employer].

## 2023-09-23 NOTE — Telephone Encounter (Signed)
 Copied from CRM [id number]. Topic: Clinical - Red Word Triage >> Sep 23, 2023  8:56 AM Turkey B wrote: [facility] that prompted transfer to Nurse Triage: pt has rash that's spreading  Chief Complaint: widespread rash Symptoms: pink rash some small and bigger bumps, at eye/between fingers/hip/arms, some eyelid swelling, 8/10 itching at worst Frequency: continual Pertinent Negatives: Patient denies fever, SOB, chest pain, worsening swelling to eye, eye half-shut, dizziness, headache, joint pain, sore throat, new meds Disposition: [] 911 / [] ED /[] Urgent Care (no appt availability in office) / [x] Appointment(In office/virtual)/ []  [place] [place] [facility]/ [] Home Care/ [] Refused Recommended Disposition /[] [place] [facility]/ []  Follow-up with PCP Additional Notes: Pt wife with pt in background reporting that pt has had rash for 4-5 days, started at eye with some swelling but not to half-way shut, eye swelling has gotten bit better, rash has spread to between fingers, on arms, and area on hip. Pt reporting itching is 8/10 if no distraction, especially between fingers. Pt wife confirms no new meds, or recent exposure was messing with insulation but several showers since and new rash areas still appearing. Advised pt be examined in next 4 hours, scheduled with PCP office, advised call back if worsening or new symptoms. Pt wife verbalized understanding.  Reason for Disposition  SEVERE itching (i.e., interferes with sleep, normal activities or school)  Answer Assessment - Initial Assessment Questions 1. APPEARANCE of RASH: "Describe the rash." (e.g., spots, blisters, raised areas, skin peeling, scaly)     Pink, not blistery or dry patches, just pink lumps, some small bumps and some bigger ones 2. SIZE: "How big are the spots?" (e.g., tip of pen, eraser, coin; inches, centimeters)     Biggest one is an inch long 3. LOCATION: "Where is the rash located?"     Started around eye was a bit swollen, went to  fingers and arms, believe some of it is bug bites but now this morning it's on his hip and clearly a rash 4. COLOR: "What color is the rash?" (Note: It is difficult to assess rash color in people with darker-colored skin. When this situation occurs, simply ask the caller to describe what they see.)     pink 5. ONSET: "When did the rash begin?"     One on his eye, didn't look like rash, just looked like eye kind of swollen, 4-5 days, eye still swollen looks little bit better, when first noticed it it was slightly more closed than the other 6. FEVER: "Do you have a fever?" If Yes, ask: "What is your temperature, how was it measured, and when did it start?"     Feels normal 7. ITCHING: "Does the rash itch?" If Yes, ask: "How bad is the itch?" (Scale 1-10; or mild, moderate, severe)     Itching, whenever able to just sit down and not distract himself close to an 8 especially between fingers 8. CAUSE: "What do you think is causing the rash?"     Thought some bug bites, was messing with insulation, but on his hip came on much later, showered several times since insulation 9. MEDICINE FACTORS: "Have you started any new medicines within the last 2 weeks?" (e.g., antibiotics)      no 10. OTHER SYMPTOMS: "Do you have any other symptoms?" (e.g., dizziness, headache, sore throat, joint pain)       denies  Protocols used: Rash or Redness - [facility]

## 2023-11-10 ENCOUNTER — Encounter: Payer: Self-pay | Admitting: Family Medicine

## 2023-11-10 ENCOUNTER — Ambulatory Visit: Admitting: Family Medicine

## 2023-11-10 VITALS — BP 133/95 | HR 74 | Temp 97.2°F | Ht 73.0 in | Wt 246.0 lb

## 2023-11-10 DIAGNOSIS — H6593 Unspecified nonsuppurative otitis media, bilateral: Secondary | ICD-10-CM

## 2023-11-10 DIAGNOSIS — L989 Disorder of the skin and subcutaneous tissue, unspecified: Secondary | ICD-10-CM | POA: Diagnosis not present

## 2023-11-10 MED ORDER — CETIRIZINE HCL 10 MG PO TABS: 10.0000 mg | ORAL_TABLET | Freq: Every day | ORAL | 11 refills | Status: AC

## 2023-11-10 MED ORDER — PREDNISONE 20 MG PO TABS: 40.0000 mg | ORAL_TABLET | Freq: Every day | ORAL | 0 refills | Status: AC

## 2023-11-10 MED ORDER — FLUTICASONE PROPIONATE 50 MCG/ACT NA SUSP: 2.0000 | Freq: Every day | NASAL | 6 refills | Status: AC

## 2023-11-10 NOTE — Progress Notes (Signed)
 Subjective:  Patient ID: Albert Jones, male    DOB: [birth year], 43 y.o.   MRN: [id number]  Patient Care Team: Cherylene Homer HERO, NP as PCP - General (Nurse Practitioner)   Chief Complaint:  Otitis Externa (Right ear- [place] )   HPI: Albert Jones is a 43 y.o. male presenting on 11/10/2023 for Otitis Externa (Right ear- [place] )   The patient presents with ear discomfort following swimming.  They have experienced ear discomfort since swimming on July 5th, describing a sensation of fluid in the ear without pain, burning, or popping. The sound in the affected ear is 'completely different' compared to the other ear, especially in the morning. No treatments or medications have been tried for this issue. No pain, burning, or popping in the ear. They report a muffled sound in the affected ear.  Additionally, they mention a gray spot on their leg that has been present for a few years. They have picked it off before, but it returns. They have a history of multiple sunburns and have used sunscreen more frequently in the past ten years. A family member had something removed from their face.          Relevant past medical, surgical, family, and social history reviewed and updated as indicated.  Allergies and medications reviewed and updated. Data reviewed: Chart in Epic.   History reviewed. No pertinent past medical history.  Past Surgical History:  Procedure Laterality Date   HERNIA REPAIR     LAPAROSCOPIC APPENDECTOMY N/A 07/12/2016   Procedure: APPENDECTOMY LAPAROSCOPIC, LAPAROSCOPIC LYSIS OF ADHESIONS, OMENTOPEXY OF STUMP, INCARCERATED SUPRAUMBILICAL HERNIA REPAIR;  Surgeon: Elspeth Schultze, MD;  Location: [facility];  Service: General;  Laterality: N/A;    Social History   Socioeconomic History   Marital status: Married    Spouse name: Samantha   Number of children: Not on file   Years of education: Not on file    Highest education level: High school graduate  Occupational History   Occupation: self employed  Tobacco Use   Smoking status: Former   Smokeless tobacco: Former  Building services engineer status: Never Used  Substance and Sexual Activity   Alcohol use: Yes    Alcohol/week: 2.0 standard drinks of alcohol    Types: 2 Shots of liquor per week    Comment: occassionally   Drug use: Not Currently    Frequency: 3.0 times per week    Types: Marijuana   Sexual activity: Yes    Birth control/protection: None  Other Topics Concern   Not on file  Social History Narrative   Not on file   Social Drivers of Health   Financial Resource Strain: Not on file  Food Insecurity: Not on file  Transportation Needs: Not on file  Physical Activity: Not on file  Stress: Not on file  Social Connections: Unknown (09/08/2021)   Received [facility]   Social Network    Social Network: Not on file  Intimate Partner Violence: Unknown (07/31/2021)   Received from [facility]   HITS    Physically Hurt: Not on file    Insult or Talk Down To: Not on file    Threaten Physical Harm: Not on file    Scream or Curse: Not on file    Outpatient Encounter Medications as of 11/10/2023  Medication Sig   cetirizine  (ZYRTEC ) 10 MG tablet Take 1 tablet (10 mg  total) by mouth daily.   fluticasone  (FLONASE ) 50 MCG/ACT nasal spray Place 2 sprays into both nostrils daily.   predniSONE  (DELTASONE ) 20 MG tablet Take 2 tablets (40 mg total) by mouth daily with breakfast for 5 days.   No facility-administered encounter medications on file as of 11/10/2023.    No Known Allergies  Pertinent ROS per HPI, otherwise unremarkable      Objective:  BP (!) 133/95   Pulse 74   Temp (!) 97.2 F (36.2 C)   Ht 6' 1 (1.854 m)   Wt 246 lb (111.6 kg)   SpO2 96%   BMI 32.46 kg/m    Wt Readings from Last 3 Encounters:  11/10/23 246 lb (111.6 kg)  09/23/23 249 lb (112.9 kg)  03/16/23 234 lb 6.4 oz (106.3 kg)     Physical Exam Vitals and nursing note reviewed.  Constitutional:      General: He is not in acute distress.    Appearance: Normal appearance. He is obese. He is not ill-appearing, toxic-appearing or diaphoretic.  HENT:     Head: Normocephalic and atraumatic.     Right Ear: A middle ear effusion is present. Tympanic membrane is not erythematous.     Left Ear: A middle ear effusion is present. Tympanic membrane is not erythematous.     Nose: Nose normal.     Mouth/Throat:     Mouth: Mucous membranes are moist.     Pharynx: Oropharynx is clear. No oropharyngeal exudate or posterior oropharyngeal erythema.  Eyes:     Conjunctiva/sclera: Conjunctivae normal.     Pupils: Pupils are equal, round, and reactive to light.  Cardiovascular:     Rate and Rhythm: Normal rate and regular rhythm.     Heart sounds: Normal heart sounds.  Pulmonary:     Effort: Pulmonary effort is normal.     Breath sounds: Normal breath sounds.  Musculoskeletal:     Right lower leg: No edema.     Left lower leg: No edema.  Skin:    General: Skin is warm and dry.     Capillary Refill: Capillary refill takes less than 2 seconds.     Findings: Lesion present.      Neurological:     General: No focal deficit present.     Mental Status: He is alert and oriented to person, place, and time.  Psychiatric:        Mood and Affect: Mood normal.        Behavior: Behavior normal.        Thought Content: Thought content normal.        Judgment: Judgment normal.       Results for orders placed or performed in visit on 11/06/22  Bayer DCA Hb A1c Waived   Collection Time: 11/06/22  9:21 AM  Result Value Ref Range   HB A1C (BAYER DCA - WAIVED) 5.5 4.8 - 5.6 %  Anemia Profile B   Collection Time: 11/06/22  9:23 AM  Result Value Ref Range   Total Iron Binding Capacity 309 250 - 450 ug/dL   UIBC 785 888 - 656 ug/dL   Iron 95 38 - 830 ug/dL   Iron Saturation 31 15 - 55 %   Ferritin 162 30 - 400 ng/mL   Vitamin  B-12 460 232 - 1,245 pg/mL   Folate 5.0 >3.0 ng/mL   WBC 5.9 3.4 - 10.8 x10E3/uL   RBC 5.43 4.14 - 5.80 x10E6/uL   Hemoglobin 16.4 13.0 - 17.7  g/dL   Hematocrit 52.2 62.4 - 51.0 %   MCV 88 79 - 97 fL   MCH 30.2 26.6 - 33.0 pg   MCHC 34.4 31.5 - 35.7 g/dL   RDW 86.8 88.3 - 84.5 %   Platelets 159 150 - 450 x10E3/uL   Neutrophils 56 Not Estab. %   Lymphs 31 Not Estab. %   Monocytes 7 Not Estab. %   Eos 3 Not Estab. %   Basos 2 Not Estab. %   Neutrophils Absolute 3.3 1.4 - 7.0 x10E3/uL   Lymphocytes Absolute 1.8 0.7 - 3.1 x10E3/uL   Monocytes Absolute 0.4 0.1 - 0.9 x10E3/uL   EOS (ABSOLUTE) 0.2 0.0 - 0.4 x10E3/uL   Basophils Absolute 0.1 0.0 - 0.2 x10E3/uL   Immature Granulocytes 1 Not Estab. %   Immature Grans (Abs) 0.1 0.0 - 0.1 x10E3/uL   Retic Ct Pct 1.6 0.6 - 2.6 %  CMP14+EGFR   Collection Time: 11/06/22  9:23 AM  Result Value Ref Range   Glucose 103 (H) 70 - 99 mg/dL   BUN 11 6 - 24 mg/dL   Creatinine, Ser 9.04 0.76 - 1.27 mg/dL   eGFR 897 >40 fO/fpw/8.26   BUN/Creatinine Ratio 12 9 - 20   Sodium 141 134 - 144 mmol/L   Potassium 4.7 3.5 - 5.2 mmol/L   Chloride 104 96 - 106 mmol/L   CO2 23 20 - 29 mmol/L   Calcium 9.5 8.7 - 10.2 mg/dL   Total Protein 6.9 6.0 - 8.5 g/dL   Albumin 4.5 4.1 - 5.1 g/dL   Globulin, Total 2.4 1.5 - 4.5 g/dL   Bilirubin Total 0.7 0.0 - 1.2 mg/dL   Alkaline Phosphatase 78 44 - 121 IU/L   AST 20 0 - 40 IU/L   ALT 28 0 - 44 IU/L  Thyroid  Panel With TSH   Collection Time: 11/06/22  9:23 AM  Result Value Ref Range   TSH 1.360 0.450 - 4.500 uIU/mL   T4, Total 7.1 4.5 - 12.0 ug/dL   T3 Uptake Ratio 25 24 - 39 %   Free Thyroxine Index 1.8 1.2 - 4.9  VITAMIN D  25 Hydroxy (Vit-D Deficiency, Fractures)   Collection Time: 11/06/22  9:23 AM  Result Value Ref Range   Vit D, 25-Hydroxy 36.9 30.0 - 100.0 ng/mL       Pertinent labs & imaging results that were available during my care of the patient were reviewed by me and considered in my medical  decision making.  Assessment & Plan:  Albert was seen today for otitis externa.  Diagnoses and all orders for this visit:  Bilateral otitis media with effusion -     fluticasone  (FLONASE ) 50 MCG/ACT nasal spray; Place 2 sprays into both nostrils daily. -     cetirizine  (ZYRTEC ) 10 MG tablet; Take 1 tablet (10 mg total) by mouth daily. -     predniSONE  (DELTASONE ) 20 MG tablet; Take 2 tablets (40 mg total) by mouth daily with breakfast for 5 days.  Skin lesion -     Ambulatory referral to Dermatology      Ear Effusion Sensation of fullness in the right ear post-swimming, attributed to eustachian tube dysfunction likely due to allergies, resulting in fluid accumulation behind the tympanic membrane. No associated pain or popping, with symptoms more pronounced in the morning. - Initiate Flonase  once daily to reduce nasal inflammation and improve eustachian tube function. - Administer Zyrtec  nightly to manage allergy symptoms. - Consider a short course of  oral steroids to reduce eustachian tube swelling.  Seborrheic Keratosis Longstanding gray spot on the leg identified as seborrheic keratosis, recurring after removal attempts. Increased risk for skin lesions due to multiple sunburns. Dermatology referral recommended for further evaluation and management, including comprehensive skin cancer screening. - Refer to dermatology for evaluation and management of seborrheic keratosis and comprehensive skin cancer screening. - Provide education on seborrheic keratosis.  Follow-up Follow-up required to ensure resolution of ear symptoms and confirm dermatology referral. - Verify accuracy of contact information for dermatology referral. - Instruct to follow up if no contact from dermatology within a week.          Continue all other maintenance medications.  Follow up plan: Return if symptoms worsen or fail to improve.   Continue healthy lifestyle choices, including diet (rich in fruits,  vegetables, and lean proteins, and low in salt and simple carbohydrates) and exercise (at least 30 minutes of moderate physical activity daily).  Educational handout given for SK  The above assessment and management plan was discussed with the patient. The patient verbalized understanding of and has agreed to the management plan. Patient is aware to call the clinic if they develop any new symptoms or if symptoms persist or worsen. Patient is aware when to return to the clinic for a follow-up visit. Patient educated on when it is appropriate to go to the emergency department.   Rosaline Bruns, FNP-C [facility] [phone]

## 2024-07-24 ENCOUNTER — Ambulatory Visit: Admitting: Physician Assistant
